# Patient Record
Sex: Male | Born: 1955
Health system: Southern US, Community
[De-identification: ages and names within clinical notes are randomized; demographics above are authoritative.]

## PROBLEM LIST (undated history)

## (undated) DIAGNOSIS — K219 Gastro-esophageal reflux disease without esophagitis: Secondary | ICD-10-CM

## (undated) DIAGNOSIS — M1990 Unspecified osteoarthritis, unspecified site: Secondary | ICD-10-CM

## (undated) DIAGNOSIS — M0230 Reiter's disease, unspecified site: Secondary | ICD-10-CM

## (undated) DIAGNOSIS — J209 Acute bronchitis, unspecified: Secondary | ICD-10-CM

## (undated) DIAGNOSIS — G4700 Insomnia, unspecified: Secondary | ICD-10-CM

## (undated) DIAGNOSIS — K635 Polyp of colon: Secondary | ICD-10-CM

## (undated) DIAGNOSIS — K5790 Diverticulosis of intestine, part unspecified, without perforation or abscess without bleeding: Secondary | ICD-10-CM

## (undated) DIAGNOSIS — T7840XA Allergy, unspecified, initial encounter: Secondary | ICD-10-CM

## (undated) DIAGNOSIS — D229 Melanocytic nevi, unspecified: Secondary | ICD-10-CM

## (undated) HISTORY — DX: Melanocytic nevi, unspecified: D22.9

## (undated) HISTORY — PX: COLONOSCOPY W/ POLYPECTOMY: SHX1380

## (undated) HISTORY — DX: Diverticulosis of intestine, part unspecified, without perforation or abscess without bleeding: K57.90

## (undated) HISTORY — DX: Acute bronchitis, unspecified: J20.9

## (undated) HISTORY — DX: Unspecified osteoarthritis, unspecified site: M19.90

## (undated) HISTORY — DX: Reiter's disease, unspecified site: M02.30

## (undated) HISTORY — DX: Allergy, unspecified, initial encounter: T78.40XA

## (undated) HISTORY — DX: Insomnia, unspecified: G47.00

## (undated) HISTORY — DX: Gastro-esophageal reflux disease without esophagitis: K21.9

## (undated) HISTORY — DX: Polyp of colon: K63.5

---

## 2000-08-06 ENCOUNTER — Encounter: Payer: Self-pay | Admitting: Family Medicine

## 2000-08-06 ENCOUNTER — Encounter: Admission: RE | Admit: 2000-08-06 | Discharge: 2000-08-06 | Payer: Self-pay | Admitting: Family Medicine

## 2003-06-17 ENCOUNTER — Emergency Department (HOSPITAL_COMMUNITY): Admission: EM | Admit: 2003-06-17 | Discharge: 2003-06-18 | Payer: Self-pay | Admitting: Emergency Medicine

## 2003-06-18 ENCOUNTER — Encounter: Payer: Self-pay | Admitting: Emergency Medicine

## 2003-08-02 ENCOUNTER — Encounter: Admission: RE | Admit: 2003-08-02 | Discharge: 2003-08-02 | Payer: Self-pay | Admitting: Family Medicine

## 2006-09-17 ENCOUNTER — Ambulatory Visit: Payer: Self-pay | Admitting: Family Medicine

## 2006-12-13 ENCOUNTER — Ambulatory Visit: Payer: Self-pay | Admitting: Family Medicine

## 2007-01-12 ENCOUNTER — Ambulatory Visit: Payer: Self-pay | Admitting: Family Medicine

## 2007-01-12 LAB — CONVERTED CEMR LAB
Cholesterol: 187 mg/dL (ref 0–200)
Glucose, Bld: 101 mg/dL — ABNORMAL HIGH (ref 70–99)
HDL: 40 mg/dL (ref >39.0)
LDL Cholesterol: 129 mg/dL — ABNORMAL HIGH (ref 0–99)
PSA: 0.55 ng/mL (ref 0.10–4.00)
Total CHOL/HDL Ratio: 4.7
Triglycerides: 92 mg/dL (ref 0–149)
VLDL: 18 mg/dL (ref 0–40)

## 2007-01-25 ENCOUNTER — Ambulatory Visit: Payer: Self-pay | Admitting: Internal Medicine

## 2007-02-07 ENCOUNTER — Encounter (INDEPENDENT_AMBULATORY_CARE_PROVIDER_SITE_OTHER): Payer: Self-pay | Admitting: Specialist

## 2007-02-07 ENCOUNTER — Ambulatory Visit: Payer: Self-pay | Admitting: Internal Medicine

## 2007-02-07 DIAGNOSIS — D126 Benign neoplasm of colon, unspecified: Secondary | ICD-10-CM | POA: Insufficient documentation

## 2007-02-07 LAB — HM COLONOSCOPY

## 2007-02-16 ENCOUNTER — Ambulatory Visit: Payer: Self-pay | Admitting: Family Medicine

## 2007-02-16 ENCOUNTER — Encounter: Payer: Self-pay | Admitting: Family Medicine

## 2007-02-16 ENCOUNTER — Encounter (INDEPENDENT_AMBULATORY_CARE_PROVIDER_SITE_OTHER): Payer: Self-pay | Admitting: Family Medicine

## 2007-02-16 LAB — CONVERTED CEMR LAB
Bilirubin Urine: NEGATIVE
Blood in Urine, dipstick: NEGATIVE
Glucose, Urine, Semiquant: NEGATIVE
Ketones, urine, test strip: NEGATIVE
Nitrite: NEGATIVE
Specific Gravity, Urine: 1.015
Urobilinogen, UA: 0.2
WBC Urine, dipstick: NEGATIVE

## 2007-05-23 ENCOUNTER — Telehealth (INDEPENDENT_AMBULATORY_CARE_PROVIDER_SITE_OTHER): Payer: Self-pay | Admitting: *Deleted

## 2007-07-01 ENCOUNTER — Ambulatory Visit: Payer: Self-pay | Admitting: Family Medicine

## 2007-07-20 ENCOUNTER — Ambulatory Visit: Payer: Self-pay | Admitting: Family Medicine

## 2007-07-22 ENCOUNTER — Telehealth (INDEPENDENT_AMBULATORY_CARE_PROVIDER_SITE_OTHER): Payer: Self-pay | Admitting: *Deleted

## 2007-08-12 ENCOUNTER — Telehealth (INDEPENDENT_AMBULATORY_CARE_PROVIDER_SITE_OTHER): Payer: Self-pay | Admitting: *Deleted

## 2007-12-14 ENCOUNTER — Telehealth (INDEPENDENT_AMBULATORY_CARE_PROVIDER_SITE_OTHER): Payer: Self-pay | Admitting: *Deleted

## 2007-12-15 ENCOUNTER — Telehealth (INDEPENDENT_AMBULATORY_CARE_PROVIDER_SITE_OTHER): Payer: Self-pay | Admitting: *Deleted

## 2007-12-27 ENCOUNTER — Ambulatory Visit: Payer: Self-pay | Admitting: Internal Medicine

## 2007-12-27 DIAGNOSIS — J309 Allergic rhinitis, unspecified: Secondary | ICD-10-CM | POA: Insufficient documentation

## 2007-12-27 DIAGNOSIS — G4700 Insomnia, unspecified: Secondary | ICD-10-CM | POA: Insufficient documentation

## 2008-10-22 ENCOUNTER — Ambulatory Visit: Payer: Self-pay | Admitting: Internal Medicine

## 2008-10-22 DIAGNOSIS — M0230 Reiter's disease, unspecified site: Secondary | ICD-10-CM | POA: Insufficient documentation

## 2008-10-22 DIAGNOSIS — H209 Unspecified iridocyclitis: Secondary | ICD-10-CM | POA: Insufficient documentation

## 2008-10-22 LAB — CONVERTED CEMR LAB
Bilirubin Urine: NEGATIVE
Blood in Urine, dipstick: NEGATIVE
Glucose, Urine, Semiquant: NEGATIVE
Ketones, urine, test strip: NEGATIVE
Nitrite: NEGATIVE
Protein, U semiquant: NEGATIVE
Specific Gravity, Urine: 1.01
Urobilinogen, UA: 0.2
WBC Urine, dipstick: NEGATIVE
pH: 6

## 2008-10-30 ENCOUNTER — Encounter (INDEPENDENT_AMBULATORY_CARE_PROVIDER_SITE_OTHER): Payer: Self-pay | Admitting: *Deleted

## 2008-11-05 ENCOUNTER — Encounter: Payer: Self-pay | Admitting: Internal Medicine

## 2009-01-14 ENCOUNTER — Encounter: Payer: Self-pay | Admitting: Internal Medicine

## 2009-01-28 ENCOUNTER — Ambulatory Visit: Payer: Self-pay | Admitting: Internal Medicine

## 2009-02-01 ENCOUNTER — Ambulatory Visit: Payer: Self-pay | Admitting: Internal Medicine

## 2009-02-06 LAB — CONVERTED CEMR LAB
Cholesterol: 208 mg/dL — ABNORMAL HIGH (ref 0–200)
Direct LDL: 134 mg/dL
HDL: 46.2 mg/dL (ref >39.00)
PSA: 0.67 ng/mL (ref 0.10–4.00)
TSH: 2.2 microintl units/mL (ref 0.35–5.50)
Total CHOL/HDL Ratio: 5
Triglycerides: 123 mg/dL (ref 0.0–149.0)
VLDL: 24.6 mg/dL (ref 0.0–40.0)

## 2009-02-11 ENCOUNTER — Encounter: Payer: Self-pay | Admitting: Internal Medicine

## 2009-04-15 ENCOUNTER — Encounter: Payer: Self-pay | Admitting: Internal Medicine

## 2009-07-23 ENCOUNTER — Encounter: Payer: Self-pay | Admitting: Internal Medicine

## 2009-07-29 ENCOUNTER — Telehealth (INDEPENDENT_AMBULATORY_CARE_PROVIDER_SITE_OTHER): Payer: Self-pay | Admitting: *Deleted

## 2009-11-14 ENCOUNTER — Encounter: Payer: Self-pay | Admitting: Internal Medicine

## 2010-01-20 ENCOUNTER — Encounter (INDEPENDENT_AMBULATORY_CARE_PROVIDER_SITE_OTHER): Payer: Self-pay | Admitting: *Deleted

## 2010-01-31 ENCOUNTER — Ambulatory Visit: Payer: Self-pay | Admitting: Internal Medicine

## 2010-02-27 ENCOUNTER — Ambulatory Visit: Payer: Self-pay | Admitting: Internal Medicine

## 2010-03-04 LAB — CONVERTED CEMR LAB
BUN: 19 mg/dL (ref 6–23)
CO2: 27 meq/L (ref 19–32)
Calcium: 8.7 mg/dL (ref 8.4–10.5)
Chloride: 104 meq/L (ref 96–112)
Cholesterol: 207 mg/dL — ABNORMAL HIGH (ref 0–200)
Creatinine, Ser: 0.8 mg/dL (ref 0.4–1.5)
Direct LDL: 139.8 mg/dL
GFR calc non Af Amer: 110.63 mL/min (ref >60)
Glucose, Bld: 90 mg/dL (ref 70–99)
HDL: 60 mg/dL (ref >39.00)
PSA: 0.79 ng/mL (ref 0.10–4.00)
Potassium: 4.2 meq/L (ref 3.5–5.1)
Sodium: 139 meq/L (ref 135–145)
TSH: 2.06 microintl units/mL (ref 0.35–5.50)
Total CHOL/HDL Ratio: 3
Triglycerides: 104 mg/dL (ref 0.0–149.0)
VLDL: 20.8 mg/dL (ref 0.0–40.0)

## 2010-03-26 ENCOUNTER — Ambulatory Visit: Payer: Self-pay | Admitting: Internal Medicine

## 2010-03-26 DIAGNOSIS — R059 Cough, unspecified: Secondary | ICD-10-CM | POA: Insufficient documentation

## 2010-03-26 DIAGNOSIS — R05 Cough: Secondary | ICD-10-CM

## 2010-03-27 ENCOUNTER — Ambulatory Visit: Payer: Self-pay | Admitting: Internal Medicine

## 2010-03-31 ENCOUNTER — Telehealth (INDEPENDENT_AMBULATORY_CARE_PROVIDER_SITE_OTHER): Payer: Self-pay | Admitting: *Deleted

## 2010-04-10 ENCOUNTER — Ambulatory Visit: Payer: Self-pay | Admitting: Pulmonary Disease

## 2010-06-02 ENCOUNTER — Encounter: Payer: Self-pay | Admitting: Internal Medicine

## 2010-08-04 ENCOUNTER — Encounter: Payer: Self-pay | Admitting: Internal Medicine

## 2010-10-27 ENCOUNTER — Encounter: Payer: Self-pay | Admitting: Internal Medicine

## 2010-11-06 NOTE — Assessment & Plan Note (Signed)
Summary: still congested/cbs   Vital Signs:  Patient profile:   55 year old male Height:      69 inches Weight:      234 pounds O2 Sat:      95 % on Room air Temp:     97.9 degrees F oral Pulse rate:   87 / minute BP sitting:   118 / 78  (left arm)  Vitals Entered By: Jeremy Johann CMA (March 26, 2010 2:21 PM)  O2 Flow:  Room air CC: STILL NO BETTER Comments --COUGH LIGHT YELLOW MUCOUS   History of Present Illness: continue with cough, symptoms started in early April He has been seen here twice, status post amoxicillin and Zithromax. He's not completely well.  Occasional cough, white to yellow sputum. He still has chest congestion without wheezing per se    ROS denies fever, weight loss, night sweats No hemoptysis No chest pain One time got SOB and slightly dizzy with exertion 2 weeks ago, since then he has been able to exercise without problems No history of tobacco abuse or asthma occ  GERD symptoms Allergy symptoms, itchy eyes- nose and sneezing, well-controlled with over-the-counter Allegra    Allergies: No Known Drug Allergies  Past History:  Past Medical History: Reviewed history from 01/28/2009 and no changes required. colonoscopy   Date:  02/07/2007   Next Due:  02/2012   Results:  Adenomatous Polyp  (Dr Marina Goodell)  Allergic rhinitis insomnia Reiter's Syndrome Dx remotely and again  12-2008 ( L  eye iritys  R foot problems ) Moles-- sees dermatology  Past Surgical History: Reviewed history from 12/27/2007 and no changes required. no  Social History: Reviewed history from 01/31/2010 and no changes required. 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   Ears:  R ear normal and L ear normal.   Nose:  not  congested Mouth:  no redness or discharge Lungs:  normal respiratory effort, no intercostal retractions, no accessory muscle use, and normal breath sounds.   Heart:  normal rate, regular rhythm, and no murmur.   Extremities:  no edema   Impression & Recommendations:  Problem # 1:  COUGH (ICD-786.2) persistent cough DDX includes reactive airway disease, GERD, postnasal dripping. Plan: chest x-ray  PFTs pulmonary referral Add  Zantac (PPIs interact with MTX)  Orders: T-2 View CXR (71020TC) Pulmonary Referral (Pulmonary) Pulmonary Referral (Pulmonary)  Problem # 2:  REITER'S DISEASE (ICD-099.3) on MTX, requests a pneumonia shot. I agree or that he needs one because he is immunosuppressed.  Complete Medication List: 1)  Zolpidem Tartrate 10 Mg Tabs (Zolpidem tartrate) .... Take one tablet at bedtime as needed 2)  Flonase 50 Mcg/act Susp (Fluticasone propionate) .... 2 spr qd 3)  Methotrexate 2.5 Mg Tabs (Methotrexate sodium) .... 3 by mouth two times a day 4)  Folic Acid 1 Mg Tabs (Folic acid) .... Once daily 5)  Lotemax 0.5 % Susp (Loteprednol etabonate) .Marland Kitchen.. 1 gtt os qid 6)  Ranitidine Hcl 300 Mg Tabs (Ranitidine hcl) .... One by mouth at bedtime  Other Orders: Pneumococcal Vaccine (16606) Admin 1st Vaccine (30160) Admin 1st Vaccine [facility]) [id number])  Patient Instructions: 1)  continue with Allegra as you're doing 2)  ranitidine  daily Prescriptions: RANITIDINE HCL 300 MG  TABS (RANITIDINE HCL) one by mouth at bedtime  #30 x 6   Entered and Authorized by:   Elita Quick E. Paz MD   Signed by:   Nolon Rod. Paz MD on 03/26/2010   Method used:   Electronically to        [facility] #[id number]* (retail)       [address]       [place], [place]  [address]       Ph: [phone]       Fax: [phone]   RxID:   [id number]    Pneumovax Vaccine    Vaccine Type: Pneumovax    Site: right deltoid    Mfr: Merck    Dose: 0.5 ml     Route: IM    Given by: Jeremy Johann CMA    Exp. Date: 07/30/2011    Lot #: [id number]    VIS given: 05/02/96 version given March 26, 2010.

## 2010-11-06 NOTE — Assessment & Plan Note (Signed)
Summary: persistent cough/jd   Visit Type:  Initial Consult Copy to:  pcp Primary Provider/Referring Provider:  Nolon Rod. Paz MD  CC:  Pt c/o chest congestion x 3 months. Pt states has been 2 rounds of abx with little relief during course.  History of Present Illness: 54/M, never smoker for evaluation of cough x 3 months. He has reiters' syndrome x 1 yr & is maintained on methotrexate 12.5 mg twice/ week & prednisone being taperd down to 5 mg now. He developed a head cold in april 2011& since hten has a cough productiv eof white phlegm, minimal especially in am. HE received 2 rounds of Abx, & was put on allegra intially , then ranitidine for GERD although he denies heartburn. Cough has now improved  - symptomatic Rx with cough syrups or mucinex did not help. CXR was nml. HE reports post nasal drip, denies heartburn or childhood h/o asthma. There is no clear environmental truigger, 1 dog at home, no diurnal variation or similar episodes in the past.  Current Medications (verified): 1)  Zolpidem Tartrate 10 Mg Tabs (Zolpidem Tartrate) .... Take One Tablet At Bedtime 2)  Flonase 50 Mcg/act Susp (Fluticasone Propionate) .... 2 Spr Once Daily As Needed 3)  Methotrexate 2.5 Mg Tabs (Methotrexate Sodium) .... 5 Tabs By Mouth Two Days A Week 4)  Folic Acid 1 Mg Tabs (Folic Acid) .... Once Daily 5)  Lotemax 0.5 % Susp (Loteprednol Etabonate) .Marland Kitchen.. 1 Gtt Os Qid 6)  Ranitidine Hcl 300 Mg Tabs (Ranitidine Hcl) .... One By Mouth At Bedtime 7)  Prednisone 5 Mg Tabs (Prednisone) .... Take 1 Tablet By Mouth Once A Day 8)  Calcium 600 Mg Tabs (Calcium) .... Take 1 Tablet By Mouth Once A Day 9)  Otc Allegra .... Take 1 Tablet By Mouth Once A Day  Allergies (verified): No Known Drug Allergies  Past History:  Past Medical History: Last updated: 01/28/2009 colonoscopy   Date:  02/07/2007   Next Due:  02/2012   Results:  Adenomatous Polyp  (Dr Marina Goodell)  Allergic rhinitis insomnia Reiter's Syndrome Dx  remotely and again  12-2008 ( L  eye iritys  R foot problems ) Moles-- sees dermatology  Past Surgical History: Last updated: 12/27/2007 no  Family History: Last updated: 01/28/2009 Dementia-- F MI--no DM--no colon ca-- no prostate ca-- no  (apparently F had BPH)  Social History: Last updated: 01/31/2010 Occupation: real Engineer, drilling Married with 2 children Never Smoked Alcohol use-yes: socially diet-- trying to eat more veggies  exercise--  "Y" twice a week up until recently   Review of Systems       The patient complains of productive cough, acid heartburn, nasal congestion/difficulty breathing through nose, and joint stiffness or pain.  The patient denies shortness of breath with activity, shortness of breath at rest, non-productive cough, coughing up blood, chest pain, irregular heartbeats, indigestion, loss of appetite, weight change, abdominal pain, difficulty swallowing, sore throat, tooth/dental problems, headaches, sneezing, itching, ear ache, anxiety, depression, hand/feet swelling, rash, change in color of mucus, and fever.    Vital Signs:  Patient profile:   55 year old male Height:      69 inches Weight:      234 pounds BMI:     34.68 O2 Sat:      96 % on Room air Temp:     98.3 degrees F oral Pulse rate:   75 / minute BP sitting:   110 / 82  (left arm) Cuff size:  regular  Vitals Entered By: Zackery Barefoot CMA (April 10, 2010 3:10 PM)  O2 Flow:  Room air CC: Pt c/o chest congestion x 3 months. Pt states has been 2 rounds of abx with little relief during course Comments Medications reviewed with patient Verified contact number and pharmacy with patient Zackery Barefoot CMA  April 10, 2010 3:13 PM    Physical Exam  Additional Exam:  pleasant HEENT - no thrush, no post nasal drip no lymphadenopathy CVS- s1s2 nml, no murmur, no JVD RS- clear, no crackles or rhonchi Abd- soft, non-tender, no organomegaly CNS- non focal Ext - no clubbing, cyanosis  or edema 4   Impression & Recommendations:  Problem # 1:  COUGH (ICD-786.2) Assessment Improved  cough was most likely post bronchitic , may have been worsened by post nasal (sinus) drip Hopefully no Rx required now since resolving. Stay on zantac x 3 more weeks Take zyrtec OTC instead of allegra x 4 weeks then stop If cough comes back, we may have to target the sinuses with therapy. Pulmomary complications of methotrexate were discussed  Orders: Consultation Level III ([address])  Medications Added to Medication List This Visit: 1)  Zolpidem Tartrate 10 Mg Tabs (Zolpidem tartrate) .... Take one tablet at bedtime 2)  Flonase 50 Mcg/act Susp (Fluticasone propionate) .... 2 spr once daily as needed 3)  Methotrexate 2.5 Mg Tabs (Methotrexate sodium) .... 5 tabs by mouth two days a week 4)  Prednisone 5 Mg Tabs (Prednisone) .... Take 1 tablet by mouth once a day 5)  Calcium 600 Mg Tabs (Calcium) .... Take 1 tablet by mouth once a day 6)  Otc Allegra  .... Take 1 tablet by mouth once a day  Patient Instructions: 1)  Copy sent to:dr paz 2)  Please schedule a follow-up appointment as needed. 3)  Your cough was most likely post bronchitis , may have been worsened by post nasal (sinus) drip 4)  Stay on zantac x 3 more weeks 5)  Take zyrtec OTC instead of allegra x 4 weeks then stop 6)  If cough comes back, we may have to target the sinuses with therapy.

## 2010-11-06 NOTE — Progress Notes (Signed)
Summary: -x-ray result  Phone Note Outgoing Call   Call placed by: Jeremy Johann CMA,  March 31, 2010 4:51 PM Details for Reason: advise patient, chest x-ray normal Summary of Call: left message to call  office.................Marland KitchenFelecia Deloach CMA  March 31, 2010 4:51 PM   Follow-up for Phone Call        Pt is aware. Army Fossa CMA  April 01, 2010 11:57 AM

## 2010-11-06 NOTE — Letter (Signed)
Summary: Stacey Drain MD Rheumatology  Stacey Drain MD Rheumatology   Imported By: Lanelle Bal 06/25/2010 10:41:17  _____________________________________________________________________  External Attachment:    Type:   Image     Comment:   External Document

## 2010-11-06 NOTE — Letter (Signed)
Summary: Stacey Drain MD Rheumatology  Stacey Drain MD Rheumatology   Imported By: Lanelle Bal 11/22/2009 13:52:51  _____________________________________________________________________  External Attachment:    Type:   Image     Comment:   External Document

## 2010-11-06 NOTE — Assessment & Plan Note (Signed)
Summary: cough/send to lab -BMP-FLP-PSA-TSH-V70/cbs   Vital Signs:  Patient profile:   55 year old male Height:      69 inches Weight:      232.25 pounds BMI:     34.42 Temp:     97.1 degrees F oral BP sitting:   130 / 80  Vitals Entered By: Kandice Hams (Feb 27, 2010 8:35 AM) CC: c/o cough is back with production/yellow   History of Present Illness: was seen with cough about a month ago, he took amoxicillin , symptoms were better temporarily. Continue with cough and occasional yellow sputum  Allergies: No Known Drug Allergies  Past History:  Past Medical History: Reviewed history from 01/28/2009 and no changes required. colonoscopy   Date:  02/07/2007   Next Due:  02/2012   Results:  Adenomatous Polyp  (Dr Marina Goodell)  Allergic rhinitis insomnia Reiter's Syndrome Dx remotely and again  12-2008 ( L  eye iritys  R foot problems ) Moles-- sees dermatology  Past Surgical History: Reviewed history from 12/27/2007 and no changes required. no  Review of Systems       no fever No wheezing No major problems with allergies like itchy eyes or itchy nose. Currently the sinus congestion is almost gone  Physical Exam  General:  alert and well-developed.   Head:  face symmetric, not tender to palpation Nose:  not congested Mouth:  no redness or discharge Lungs:  clear to auscultation bilaterally   Impression & Recommendations:  Problem # 1:  BRONCHITIS- ACUTE (ICD-466.0) persistent cough with some sputum sinus symptoms and allergies better Plan: cover for atypical infections with Zithromax,Robitussin-DM, patient will call in few weeks if no better I prescribed prednisone but he is already taking prednisone by mouth, prescription voided His updated medication list for this problem includes:    Zithromax Z-pak 250 Mg Tabs (Azithromycin) .Marland Kitchen... As directed  Complete Medication List: 1)  Zolpidem Tartrate 10 Mg Tabs (Zolpidem tartrate) .... Take one tablet at bedtime as  needed 2)  Flonase 50 Mcg/act Susp (Fluticasone propionate) .... 2 spr qd 3)  Methotrexate 2.5 Mg Tabs (Methotrexate sodium) .... 3 by mouth two times a day 4)  Prednisone 10 Mg Tabs (Prednisone) .... As directed 5)  Folic Acid 1 Mg Tabs (Folic acid) .... Once daily 6)  Lotemax 0.5 % Susp (Loteprednol etabonate) .Marland Kitchen.. 1 gtt os qid 7)  Zithromax Z-pak 250 Mg Tabs (Azithromycin) .... As directed  Other Orders: Venipuncture (08144) TLB-BMP (Basic Metabolic Panel-BMET) (80048-METABOL) TLB-TSH (Thyroid Stimulating Hormone) (84443-TSH) TLB-PSA (Prostate Specific Antigen) (84153-PSA) TLB-Lipid Panel (80061-LIPID)  Patient Instructions: 1)  continue with allegra 2)  zpack , antibiotic, as prescribed 3)    Prescriptions: PREDNISONE 10 MG TABS (PREDNISONE) 3 by mouth once daily x 3, 2x3,1x3  #15 x 0   Entered and Authorized by:   Ebonye Reade E. Paxton Kanaan MD   Signed by:   Nolon Rod. Annalysa Mohammad MD on 02/27/2010   Method used:   Electronically to        [facility] #[id number]* (retail)       [address]       [place], [place]  [address]       Ph: [phone]       Fax: [phone]   RxID:   [id number] ZITHROMAX Z-PAK 250 MG TABS (AZITHROMYCIN) as directed  #1 x 0   Entered and Authorized by:   Nolon Rod. Rawan Riendeau MD  Signed by:   Nolon Rod Jeanpierre Thebeau MD on 02/27/2010   Method used:   Electronically to        [facility] #[id number]* (retail)       [address]       [place], [place]  [address]       Ph: [phone]       Fax: [phone]   RxID:   [id number]

## 2010-11-06 NOTE — Letter (Signed)
Summary: Primary Care Appointment Letter  [facility]  [address] [place], [place] [address]   Phone: [phone]  Fax: [phone]    01/20/2010 MRN: [id number]  Steven Lara [address] [place], [place]  [address]  Dear Mr. Grayson,   Your Primary Care Physician [place] E. Paz MD has indicated that:    ___X____it is time to schedule an appointment.  Please call our office @ [phone] to schedule a complete physical with Dr. Drue Novel.    Thank you,     Primary Care Scheduler

## 2010-11-06 NOTE — Assessment & Plan Note (Signed)
Summary: CPX--PH   Vital Signs:  Patient profile:   55 year old male Height:      69 inches Weight:      227.6 pounds BMI:     33.73 Pulse rate:   74 / minute BP sitting:   110 / 80  Vitals Entered By: Shary Decamp (January 31, 2010 2:08 PM) CC: cpx, not fasting   History of Present Illness: CPX also having a cold for 4 weeks: Chest congestion, cough, some sputum ; overall better for the last few days no fever slight sinus congestion, itchy eyes and nose  Preventive Screening-Counseling & Management  Caffeine-Diet-Exercise     Caffeine use/day: 2  Current Medications (verified): 1)  Zolpidem Tartrate 10 Mg Tabs (Zolpidem Tartrate) .... Take One Tablet At Bedtime As Needed - Due Office Visit For Additional Refills 2)  Flonase 50 Mcg/act Susp (Fluticasone Propionate) .... 2 Spr Qd 3)  Methotrexate 2.5 Mg Tabs (Methotrexate Sodium) .... 3 By Mouth Two Times A Day 4)  Prednisone 10 Mg Tabs (Prednisone) .... As Directed 5)  Folic Acid 1 Mg Tabs (Folic Acid) .... Once Daily 6)  Lotemax 0.5 % Susp (Loteprednol Etabonate) .Marland Kitchen.. 1 Gtt Os Qid  Allergies (verified): No Known Drug Allergies  Past History:  Past Medical History: Reviewed history from 01/28/2009 and no changes required. colonoscopy   Date:  02/07/2007   Next Due:  02/2012   Results:  Adenomatous Polyp  (Dr Marina Goodell)  Allergic rhinitis insomnia Reiter's Syndrome Dx remotely and again  12-2008 ( L  eye iritys  R foot problems ) Moles-- sees dermatology  Past Surgical History: Reviewed history from 12/27/2007 and no changes required. no  Family History: Reviewed history from 01/28/2009 and no changes required. Dementia-- F MI--no DM--no colon ca-- no prostate ca-- no  (apparently F had BPH)  Social History: Reviewed history from 01/28/2009 and no changes required. 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 GI:  Denies bloody stools, diarrhea, nausea, and vomiting. GU:  Denies dysuria, hematuria, urinary frequency, and urinary hesitancy. Psych:  Denies anxiety and depression.  Physical Exam  General:  alert and well-developed.   Head:  face symetric , slightly  tender at maxilary and frontal sinus  Ears:  R ear normal and L ear normal.   Mouth:  no redness or d/c  Lungs:  normal respiratory effort, no intercostal retractions, no accessory muscle use, and normal breath sounds.   Heart:  normal rate, regular rhythm, and no murmur.   Abdomen:  soft, non-tender, no distention, no masses, no guarding, and no rigidity.   Rectal:  ext hemorrhoids  noted. Normal sphincter tone. No rectal masses or tenderness. Prostate:  Prostate gland firm and smooth, no enlargement, nodularity, tenderness, mass, asymmetry or induration. Extremities:  no pretibial edema bilaterally  Psych:  Oriented X3, memory intact for recent and remote, normally interactive, good eye contact, not anxious appearing, and not depressed appearing.     Impression & Recommendations:  Problem # 1:  HEALTH SCREENING (ICD-V70.0) adacel 01-2007 rec healthy diet and exercise Colonoscopy (Dr Marina Goodell) :   Date:  02/07/2007   Next Due:  02/2012 multiple moles, last seen derm 2.5 years ago,  again   rec to see dermatology yearly  2/11 labs @ rheumatology  showed a white count of 11, hemoglobin 15.4, platelets 225.  Glucose 125, creatinine 0.8, AST 15. labs, see instructions   Problem # 2:  ALLERGIC RHINITIS (ICD-477.9) allergies +/- sinusitis. see instructions   His updated medication list for this problem includes:    Flonase 50 Mcg/act Susp (Fluticasone propionate) .Marland Kitchen... 2 spr qd  Problem # 3:  INSOMNIA-SLEEP DISORDER-UNSPEC (ICD-780.52) refill medications His updated medication list for this  problem includes:    Zolpidem Tartrate 10 Mg Tabs (Zolpidem tartrate) .Marland Kitchen... Take one tablet at bedtime as needed  Complete Medication List: 1)  Zolpidem Tartrate 10 Mg Tabs (Zolpidem tartrate) .... Take one tablet at bedtime as needed 2)  Flonase 50 Mcg/act Susp (Fluticasone propionate) .... 2 spr qd 3)  Methotrexate 2.5 Mg Tabs (Methotrexate sodium) .... 3 by mouth two times a day 4)  Prednisone 10 Mg Tabs (Prednisone) .... As directed 5)  Folic Acid 1 Mg Tabs (Folic acid) .... Once daily 6)  Lotemax 0.5 % Susp (Loteprednol etabonate) .Marland Kitchen.. 1 gtt os qid 7)  Amoxicillin 500 Mg Caps (Amoxicillin) .... 2 by mouth two times a day  Patient Instructions: 1)  rest-fluids-tylenol 2)  mucinex DM two times a day as needed for cough  unti lbetter 3)  amoxicillin x 10 days 4)  claritin 10mg  a day  x 1 week 5)  call if no better in few days  6)  came back fasting for labs: 7)  BMP FLP PSA TSH----dx V70 8)  Please schedule a follow-up appointment in 1 year.  Prescriptions: ZOLPIDEM TARTRATE 10 MG TABS (ZOLPIDEM TARTRATE) Take one tablet at bedtime as needed  #30 x 6   Entered and Authorized by:   Elita Quick E. Paz MD   Signed by:   Nolon Rod. Paz MD on 01/31/2010   Method used:   Print then Give to Patient   RxID:   [id number] AMOXICILLIN 500 MG CAPS (AMOXICILLIN) 2 by mouth two times a day  #40 x 0   Entered and Authorized by:   Nolon Rod. Paz MD   Signed by:   Nolon Rod. Paz MD on 01/31/2010   Method used:   Print then Give to Patient   RxID:   [id number] FLONASE 50 MCG/ACT SUSP (FLUTICASONE PROPIONATE) 2 spr qd  #1 x 11   Entered by:   Shary Decamp   Authorized by:   Nolon Rod. Paz MD   Signed by:   Shary Decamp on 01/31/2010   Method used:   Electronically to        [facility] #[id number]* (retail)       [address]       [place], [place]  [address]       Ph: [phone]       Fax: [phone]   RxID:   [id number]    Preventive Care  Screening  Prior Values:    PSA:  0.67 (02/01/2009)    Colonoscopy:  Adenomatous Polyp (02/07/2007)    Last Tetanus Booster:  Historical (01/12/2007)    Risk Factors:  Tobacco use:  never Passive smoke exposure:  no Drug use:  no Caffeine use:  2 drinks per day Alcohol use:  yes    Type:  socially Exercise:  yes    Times per week:  2    Type:  walking/biking  Colonoscopy History:    Date of Last Colonoscopy:  02/07/2007

## 2010-11-06 NOTE — Letter (Signed)
Summary: Stacey Drain MD Rheumatology  Stacey Drain MD Rheumatology   Imported By: Lanelle Bal 08/14/2010 13:18:59  _____________________________________________________________________  External Attachment:    Type:   Image     Comment:   External Document

## 2010-11-26 NOTE — Letter (Signed)
Summary: Rheumatology-Dr. Stacey Drain  Rheumatology-Dr. Stacey Drain   Imported By: Maryln Gottron 11/10/2010 11:06:56  _____________________________________________________________________  External Attachment:    Type:   Image     Comment:   External Document

## 2011-01-09 ENCOUNTER — Other Ambulatory Visit: Payer: Self-pay | Admitting: Internal Medicine

## 2011-01-22 ENCOUNTER — Ambulatory Visit (INDEPENDENT_AMBULATORY_CARE_PROVIDER_SITE_OTHER): Payer: PRIVATE HEALTH INSURANCE | Admitting: Internal Medicine

## 2011-01-22 ENCOUNTER — Encounter: Payer: Self-pay | Admitting: Internal Medicine

## 2011-01-22 DIAGNOSIS — M549 Dorsalgia, unspecified: Secondary | ICD-10-CM

## 2011-01-22 DIAGNOSIS — M546 Pain in thoracic spine: Secondary | ICD-10-CM

## 2011-01-22 MED ORDER — CYCLOBENZAPRINE HCL 10 MG PO TABS: 10.0000 mg | ORAL_TABLET | Freq: Two times a day (BID) | ORAL | Status: AC | PRN

## 2011-01-22 MED ORDER — HYDROCODONE-ACETAMINOPHEN 5-500 MG PO TABS: 1.0000 | ORAL_TABLET | Freq: Three times a day (TID) | ORAL | Status: DC | PRN

## 2011-01-22 NOTE — Assessment & Plan Note (Signed)
Upper back pain for a few days, some ill-defined paresthesias in the left arm, neurological exam normal. Most likely this is due to a musculoskeletal issue, is less likely that this is related to Reiter's syndrome. Plan: See instructions

## 2011-01-22 NOTE — Patient Instructions (Signed)
Motrin 200mg  OTC 2 tabs every 6 hours as needed Heating pad Hydrocodone and flexeril (muscle relaxant) as needed , will cause drowsiness Call if no better in few days

## 2011-01-22 NOTE — Progress Notes (Signed)
  Subjective:    Patient ID: Steven Lara, male    DOB: 11/29/1955, 55 y.o.   MRN: [id number]  HPI 5 days ago developed upper back pain, mostly left sided, he also felt a "knot" but the left shoulder blade. He can't sleep due to the discomfort however he has been able to do some yard work even today. For days ago he developed ill-defined and tingling in the left arm weak grip.  Past Medical History  Diagnosis Date  . Allergic rhinitis   . Insomnia   . Reiter's syndrome     dx remotely and again 12/2008 (L eye iritys R foot problems)  . Mole (skin)     sees derm   No past surgical history on file.   Review of Systems Very mild neck pain per se, actually the neck is described as "stiff" He admits to a lot of emotional distress lately mostly due to work and being extremely busy Denies recent injuries or heavy lifting As far as  Reiter's syndrome, he sees rheumatology routinely, he takes methotrexate. He does not believe his pain is related to that condition. Denies fevers    Objective:   Physical Exam  Constitutional: He is oriented to person, place, and time. He appears well-developed and well-nourished.  Neck: Normal range of motion. Neck supple.  Musculoskeletal:       Palpation of the neck and upper back without any abnormalities.  Neurological: He is alert and oriented to person, place, and time. He has normal reflexes. He exhibits normal muscle tone.       Motor exam of the upper extremities normal          Assessment & Plan:

## 2011-02-11 ENCOUNTER — Other Ambulatory Visit: Payer: Self-pay | Admitting: Internal Medicine

## 2011-02-11 NOTE — Telephone Encounter (Signed)
Ok 30, 5 RF 

## 2011-02-20 NOTE — Assessment & Plan Note (Signed)
[facility]                        [place] OFFICE NOTE   NAME:Steven Lara, Steven Lara                         MRN:          [id number]  DATE:12/13/2006                            DOB:          1955-12-20    REASON FOR VISIT:  Discomfort when sitting.   HISTORY OF PRESENT ILLNESS:  Steven Lara is a 55 year old male who over  the last four days he has been having a annoyance type of discomfort  when he sits down.  He states it is located just below his coccyx.  He  reports it is not tender to the touch.  He does sit in front of the  computer for several hours a day.  He also notes a slight increase in  bowel movement.  There has been no diarrhea or abdominal pain.  Mr.  Lara also reports that he has noticed an increase in relief after he  urinates from baseline.  He denies any dysuria or urinary frequency.  He  does report history of prostatitis 15 years ago.   MEDICATIONS:  Ambien 10 mg q.h.s. p.r.n.   ALLERGIES:  No drug allergies.   REVIEW OF SYSTEMS:  As per HPI, otherwise, unremarkable.   OBJECTIVE:  VITAL SIGNS:  Blood pressure 1112/82, weight 230, pulse 76.  GENERAL: Pleasant male in no acute distress, questioned appropriately.  ABDOMEN:  Significant for no palpable masses, nontender, positive bowel  sounds.  RECTAL:  Normal tone with a mildly enlarged boggy prostate, tender to  palpation.   STUDIES:  UA was clear.   IMPRESSION:  A 55 year old male with symptoms suggestive of new  prostatitis.   PLAN:  1. Will start on Bactrim DS one pill b.i.d. x14 days.  2. Advise if symptoms worsened, he is to follow up.  3. The patient also advised if the symptoms do not improve over the      next 10-14 days, he is to call our office.  The patient is      scheduled to follow up with me in about three weeks for a complete      physical examination.     Leanne Chang, M.D.  Electronically Signed    LA/MedQ  DD: 12/13/2006  DT: 12/14/2006   Job #: [id number]

## 2011-08-08 ENCOUNTER — Other Ambulatory Visit: Payer: Self-pay | Admitting: Internal Medicine

## 2011-08-10 MED ORDER — ZOLPIDEM TARTRATE 10 MG PO TABS: 10.0000 mg | ORAL_TABLET | Freq: Every evening | ORAL | Status: DC | PRN

## 2011-09-07 ENCOUNTER — Other Ambulatory Visit: Payer: Self-pay | Admitting: Internal Medicine

## 2011-09-14 NOTE — Telephone Encounter (Signed)
Pharmacy received refill on 08/08/11.  They did not receive refill auth on 11/5 with additional refills.  Verbal order given to Lauren for #30, with 1 additional refill.

## 2011-09-14 NOTE — Telephone Encounter (Signed)
Patient still waoting for refill [facility]

## 2011-11-07 ENCOUNTER — Other Ambulatory Visit: Payer: Self-pay | Admitting: Internal Medicine

## 2011-11-09 NOTE — Telephone Encounter (Signed)
Ambien 10mg  #30 with zero refills. Last refilled on Aug 10, 2011. OK to refill?

## 2011-11-10 NOTE — Telephone Encounter (Signed)
30, no RF, tell pt needs OV before next RF

## 2011-11-10 NOTE — Telephone Encounter (Signed)
Refill done.  

## 2011-12-10 ENCOUNTER — Other Ambulatory Visit: Payer: Self-pay | Admitting: Internal Medicine

## 2011-12-10 NOTE — Telephone Encounter (Signed)
Patient has a CPE appointment Monday 3/11.

## 2011-12-10 NOTE — Telephone Encounter (Signed)
Refill request Ambien 10mg . #30 with zero refills. Last refilled on 2.2.13. OK to refill?

## 2011-12-10 NOTE — Telephone Encounter (Signed)
Needs OV.  

## 2011-12-11 NOTE — Telephone Encounter (Signed)
Pt has appt Monday 12/14/11

## 2011-12-12 ENCOUNTER — Other Ambulatory Visit: Payer: Self-pay | Admitting: Internal Medicine

## 2011-12-14 ENCOUNTER — Ambulatory Visit (INDEPENDENT_AMBULATORY_CARE_PROVIDER_SITE_OTHER): Payer: PRIVATE HEALTH INSURANCE | Admitting: Internal Medicine

## 2011-12-14 DIAGNOSIS — Z0000 Encounter for general adult medical examination without abnormal findings: Secondary | ICD-10-CM | POA: Insufficient documentation

## 2011-12-14 MED ORDER — ZOLPIDEM TARTRATE 10 MG PO TABS: ORAL_TABLET | ORAL | Status: DC

## 2011-12-14 NOTE — Assessment & Plan Note (Signed)
adacel 01-2007 rec to continue w/ his  healthy diet and exercise Colonoscopy (Dr Marina Goodell) :   Date:  02/07/2007   Next per GI ( Due:  02/2012) Due to see dermatology   Has routine  labs @ rheumatology  , see instructions  Prostate ca screening-- guidelines are changing, rec PSA DRE q 2 years

## 2011-12-14 NOTE — Patient Instructions (Signed)
Return fasting at some point this week: FLP TSH PSA--- dx V70 Is very important  you see the dermatologist at [facility]

## 2011-12-14 NOTE — Progress Notes (Signed)
  Subjective:    Patient ID: Steven Lara, male    DOB: 08/26/1956, 55 y.o.   MRN: [id number]  HPI  CPX  Past Medical History: Reiter's Syndrome Dx remotely and again  12-2008 ( L  eye iritys  R foot problems )( Dr Kellie Simmering and ophthalmology) Allergic rhinitis insomnia colonoscopy   Date:  02/07/2007   Next Due:  02/2012   Results:  Adenomatous Polyp  (Dr Marina Goodell)  [facility]-- sees dermatology  Past Surgical History: no  Family History: Dementia-- F MI--no DM--no colon ca-- no prostate ca-- no  (apparently F had BPH)  Social History: Occupation: Naval architect Married with 2 children Never Smoked Alcohol use-yes-- rarely  diet-- exercise-->   Exercise  twice a week, eating better, recent wt loss   Review of Systems  Constitutional: Negative for fever and fatigue.  Cardiovascular: Negative for chest pain and leg swelling.  Gastrointestinal: Negative for abdominal pain and blood in stool.  Genitourinary: Negative for dysuria and hematuria.  Psychiatric/Behavioral:       No depression or anxiety        Objective:   Physical Exam  General:  alert and well-developed.   Neck-- no thyromegaly, nl carotid pulses Lungs:  normal respiratory effort, no intercostal retractions, no accessory muscle use, and normal breath sounds.   Heart:  normal rate, regular rhythm, and no murmur.   Abdomen:  soft, non-tender, no distention, no masses, no guarding, and no rigidity.   Rectal:  ext hemorrhoids  noted. Normal sphincter tone. No rectal masses or tenderness. Prostate:  Prostate gland firm and smooth, no enlargement, nodularity, tenderness, mass, asymmetry or induration. Extremities:  no pretibial edema bilaterally  Psych:  Oriented X3, memory intact for recent and remote, normally interactive, good eye contact, not anxious appearing, and not depressed appearing.        Assessment & Plan:

## 2011-12-15 ENCOUNTER — Other Ambulatory Visit: Payer: Self-pay | Admitting: Dermatology

## 2011-12-17 ENCOUNTER — Other Ambulatory Visit (INDEPENDENT_AMBULATORY_CARE_PROVIDER_SITE_OTHER): Payer: PRIVATE HEALTH INSURANCE

## 2011-12-17 DIAGNOSIS — Z0000 Encounter for general adult medical examination without abnormal findings: Secondary | ICD-10-CM

## 2011-12-17 LAB — LIPID PANEL
HDL: 44.8 mg/dL (ref >39.00)
Total CHOL/HDL Ratio: 4
Triglycerides: 97 mg/dL (ref 0.0–149.0)

## 2011-12-21 ENCOUNTER — Encounter: Payer: Self-pay | Admitting: *Deleted

## 2012-01-28 ENCOUNTER — Encounter: Payer: Self-pay | Admitting: Internal Medicine

## 2012-02-01 ENCOUNTER — Encounter: Payer: Self-pay | Admitting: Internal Medicine

## 2012-02-01 ENCOUNTER — Ambulatory Visit (INDEPENDENT_AMBULATORY_CARE_PROVIDER_SITE_OTHER): Payer: PRIVATE HEALTH INSURANCE | Admitting: Internal Medicine

## 2012-02-01 VITALS — BP 122/76 | HR 102 | Temp 98.1°F | Wt 217.0 lb

## 2012-02-01 DIAGNOSIS — J309 Allergic rhinitis, unspecified: Secondary | ICD-10-CM

## 2012-02-01 MED ORDER — AZITHROMYCIN 250 MG PO TABS: ORAL_TABLET | ORAL | Status: AC

## 2012-02-01 MED ORDER — FLUTICASONE PROPIONATE 50 MCG/ACT NA SUSP: 2.0000 | Freq: Every day | NASAL | Status: DC

## 2012-02-01 NOTE — Progress Notes (Signed)
  Subjective:    Patient ID: Steven Lara, male    DOB: 1956-01-01, 56 y.o.   MRN: [id number]  HPI Acute visit 3 days history of head and chest congestion, itchy watery eyes, sneezing, some headaches ear aches. She also has cough with yellow sputum. Unfortunately, his wife has cancer, will start chemotherapy this week, he is concerned that whatever infection he has made pass it to her.  Past Medical History:  Reiter's Syndrome Dx remotely and again 12-2008 ( L eye iritys R foot problems )( Dr Kellie Simmering and ophthalmology)  Allergic rhinitis  insomnia  colonoscopy Date: 02/07/2007 Next Due: 02/2012 Results: Adenomatous Polyp (Dr Marina Goodell)  [facility]-- sees dermatology  Past Surgical History:  no  Family History:  Dementia-- F  MI--no  DM--no  colon ca-- no  prostate ca-- no (apparently F had BPH)  Social History:  Occupation: Naval architect  Married , wife dx w/ rectal cancer 2013 with 2 children  Never Smoked  Alcohol use-yes-- rarely    Review of Systems Denies fever or chills, mild sore throat Some difficulty sleeping due to  the symptoms   Objective:   Physical Exam General -- alert, well-developed. No apparent distress.  HEENT -- TMs bulge, no red, no d/c; throat w/o redness, face symmetric and not tender to palpation, nose  Congested  Lungs -- normal respiratory effort, no intercostal retractions, no accessory muscle use, and normal breath sounds.   Heart-- normal rate, regular rhythm, no murmur, and no gallop.   Extremities-- no pretibial edema bilaterally  Neurologic-- alert & oriented X3 and strength normal in all extremities. Psych-- Cognition and judgment appear intact. Alert and cooperative with normal attention span and concentration.  not anxious appearing and not depressed appearing.       Assessment & Plan:

## 2012-02-01 NOTE — Assessment & Plan Note (Addendum)
Patient presents with symptoms more consistent with allergies than a  URI however he is immunosuppressed, wife will start chemotherapy soon and will be immunosupressed as well. Plan: start Zithromax, see instructions

## 2012-02-01 NOTE — Patient Instructions (Addendum)
Rest, fluids , tylenol For cough, take Mucinex DM twice a day as needed  For congestion use flonase  nasal spray once a day until you feel better Cont zyrtec  Take the antibiotic as prescribed  (zithromax) Call if no better in few days Call anytime if the symptoms are severe, you have high fever

## 2012-05-10 ENCOUNTER — Other Ambulatory Visit: Payer: Self-pay | Admitting: Internal Medicine

## 2012-05-10 NOTE — Telephone Encounter (Signed)
Refilled Ambien, #30 and 5 refills

## 2012-05-10 NOTE — Telephone Encounter (Signed)
Ok to refill 

## 2012-08-16 ENCOUNTER — Ambulatory Visit (INDEPENDENT_AMBULATORY_CARE_PROVIDER_SITE_OTHER): Payer: PRIVATE HEALTH INSURANCE | Admitting: Internal Medicine

## 2012-08-16 VITALS — BP 132/76 | HR 93 | Temp 97.3°F | Wt 221.0 lb

## 2012-08-16 DIAGNOSIS — H6690 Otitis media, unspecified, unspecified ear: Secondary | ICD-10-CM

## 2012-08-16 MED ORDER — PREDNISONE 10 MG PO TABS: ORAL_TABLET | ORAL | Status: DC

## 2012-08-16 MED ORDER — AZITHROMYCIN 250 MG PO TABS: ORAL_TABLET | ORAL | Status: DC

## 2012-08-16 NOTE — Progress Notes (Signed)
  Subjective:    Patient ID: Steven Lara, male    DOB: [birth year], 56 y.o.   MRN: [id number]  HPI Acute visit Symptoms started 5 days ago with chest and sinus congestion, yesterday he developed bilateral ear ache, felt a lot of pressure better, having difficulty with hearing.  Past Medical History:   Reiter's Syndrome Dx remotely and again 12-2008 ( L eye iritys R foot problems )( Dr Kellie Simmering and ophthalmology)   Allergic rhinitis   insomnia   colonoscopy Date: 02/07/2007 Next Due: 02/2012 Results: Adenomatous Polyp (Dr Marina Goodell)   [facility]-- sees dermatology   Past Surgical History:   no   Family History:   Dementia-- F   MI--no   DM--no   colon ca-- no   prostate ca-- no (apparently F had BPH)   Social History:   Occupation: Naval architect   Married , wife dx w/ rectal cancer 2013-- going to [place] [employer] [facility]) with 2 children   Never Smoked   Alcohol use-yes-- rarely    Review of Systems No fever or chills,some sore throat. He does have nose discharge which is clear and occasional cough green yellow sputum. No shortness of breath.    Objective:   Physical Exam  General -- alert, well-developed, and overweight appearing. No apparent distress.  HEENT --  Nose congested, R>L TM bulging, redness and swelling. Air fluid level on the right. No discharge, canal normal. Throat with mild redness otherwise normal Lungs -- normal respiratory effort, no intercostal retractions, no accessory muscle use, and normal breath sounds.   Heart-- normal rate, regular rhythm, no murmur, and no gallop.   Psych-- Cognition and judgment appear intact. Alert and cooperative with normal attention span and concentration.  not anxious appearing and not depressed appearing.      Assessment & Plan:     Bilateral otitis media in the setting of a URI.  Patient is immunosuppressed. Plan: Flonase, low dose prednisone. Amoxicillin may interfere with methotrexate, will use Zithromax. If he's not  responding well, may need a second round of antibiotics. Patient is quite concerned about the decreased hearing, I think that once we cleared the infection/inflammation from the ears, he should go back to normal, if not may need ENT referral.

## 2012-08-16 NOTE — Patient Instructions (Addendum)
Take prednisone for 5 days Take Zithromax as prescribed Use Flonase consistently every day for days to 2 weeks. Mucinex DM as needed if cough Call if you're not  improving in the next few days.

## 2012-08-17 ENCOUNTER — Encounter: Payer: Self-pay | Admitting: Internal Medicine

## 2012-08-24 ENCOUNTER — Encounter: Payer: Self-pay | Admitting: Internal Medicine

## 2012-08-24 ENCOUNTER — Ambulatory Visit (INDEPENDENT_AMBULATORY_CARE_PROVIDER_SITE_OTHER): Payer: PRIVATE HEALTH INSURANCE | Admitting: Internal Medicine

## 2012-08-24 VITALS — BP 112/74 | HR 85 | Temp 97.5°F | Wt 223.0 lb

## 2012-08-24 DIAGNOSIS — H6690 Otitis media, unspecified, unspecified ear: Secondary | ICD-10-CM

## 2012-08-24 MED ORDER — CLINDAMYCIN HCL 150 MG PO CAPS: 300.0000 mg | ORAL_CAPSULE | Freq: Three times a day (TID) | ORAL | Status: DC

## 2012-08-24 NOTE — Progress Notes (Signed)
  Subjective:    Patient ID: Steven Lara, male    DOB: 1956-06-23, 56 y.o.   MRN: [id number]  HPI Acute visit Since the last time he was here, he is taking the medications as prescribed. In general he is better but he still has some fatigue, feeling rundown. His wife is a cancer patient, has leukopenia, he is concerned about that.  Past Medical History:   Reiter's Syndrome Dx remotely and again 12-2008 ( L eye iritys R foot problems )( Dr Kellie Simmering and ophthalmology)   Allergic rhinitis   insomnia   colonoscopy Date: 02/07/2007 Next Due: 02/2012 Results: Adenomatous Polyp (Dr Marina Goodell)   [facility]-- sees dermatology   Past Surgical History:   no   Family History:   Dementia-- F   MI--no   DM--no   colon ca-- no   prostate ca-- no (apparently F had BPH)   Social History:   Occupation: Naval architect   Married , wife dx w/ rectal cancer 2013-- going to [place] [employer] [facility]) 2 children   Never Smoked   Alcohol use-yes-- rarely    Review of Systems No ear pain in the last 2 days, no ear discharge. Denies fever or chills Still has some chest congestion, occasional yellow sputum. Hearing is essentially back to normal.    Objective:   Physical Exam General -- alert, well-developed HEENT -- TMs  better, no bulging, no redness. If anything the TMs are slightly retracted. Canal is normal. Throat without redness, nose is slightly congested, face is nontender to palpation. Lungs -- normal respiratory effort, no intercostal retractions, no accessory muscle use, and normal breath sounds.   Heart-- normal rate, regular rhythm, no murmur, and no gallop.    Psych-- Cognition and judgment appear intact. Alert and cooperative with normal attention span and concentration.  not anxious appearing and not depressed appearing.      Assessment & Plan:   Bilateral otitis media in the setting of a URI.  Improving, patient is concerned about his wife having neutropenia, I doubt he is contagious  nevertheless cough precautions discussed. On the other hand, he is not completely well and he is immunosuppressed. He takes Imuran and methotrexate, many antibiotics interact w/ such medicines except Avelox and clindamycin. Plan is to give him a second round of abx w/ clindamycin for 5 days. I prefer to avoid Avelox for now, b/c he may need it at some point if he has a   more serious infection. See instructions

## 2012-08-24 NOTE — Patient Instructions (Addendum)
Take clinda  as prescribed Continue  Flonase consistently every day until better. Mucinex DM as needed if cough Call if you don't continue improving

## 2012-08-25 ENCOUNTER — Telehealth: Payer: Self-pay

## 2012-08-25 MED ORDER — MOXIFLOXACIN HCL 400 MG PO TABS: 400.0000 mg | ORAL_TABLET | Freq: Every day | ORAL | Status: DC

## 2012-08-25 NOTE — Telephone Encounter (Signed)
Refill done.  

## 2012-08-25 NOTE — Telephone Encounter (Signed)
Pt LMOVM states antibiotic Cleocin Paz prescribed at OV 08/24/12 making him feel nauseous need another med sent. PLz advise pt  ZO:[id number] MW

## 2012-08-25 NOTE — Telephone Encounter (Signed)
avelox 500 1 po qd #5 , no RF

## 2012-11-02 ENCOUNTER — Encounter: Payer: Self-pay | Admitting: *Deleted

## 2012-11-02 ENCOUNTER — Other Ambulatory Visit: Payer: Self-pay | Admitting: Internal Medicine

## 2012-11-02 NOTE — Telephone Encounter (Signed)
Ok to refill? Last OV 11.20.13 Last filled 8.6.13

## 2012-11-02 NOTE — Telephone Encounter (Signed)
Pt made aware rx & controlled substance contract is ready to be picked up at front desk.  

## 2012-11-02 NOTE — Telephone Encounter (Signed)
done

## 2012-12-01 ENCOUNTER — Encounter: Payer: Self-pay | Admitting: Internal Medicine

## 2012-12-20 ENCOUNTER — Other Ambulatory Visit: Payer: Self-pay | Admitting: Dermatology

## 2013-01-10 ENCOUNTER — Encounter: Payer: Self-pay | Admitting: Internal Medicine

## 2013-02-07 ENCOUNTER — Other Ambulatory Visit: Payer: Self-pay | Admitting: Dermatology

## 2013-03-06 ENCOUNTER — Telehealth: Payer: Self-pay | Admitting: Internal Medicine

## 2013-03-06 NOTE — Telephone Encounter (Signed)
Ok to refill? Last OV 11.20.13 Last filled 1.29.14

## 2013-03-07 ENCOUNTER — Encounter: Payer: Self-pay | Admitting: *Deleted

## 2013-03-07 NOTE — Telephone Encounter (Signed)
Pt made aware he is due for a CPE.  Refill faxed.

## 2013-03-07 NOTE — Telephone Encounter (Signed)
RF done. Please advice patient is due for a physical at his convenience.

## 2013-06-06 ENCOUNTER — Other Ambulatory Visit: Payer: Self-pay | Admitting: Internal Medicine

## 2013-06-06 NOTE — Telephone Encounter (Signed)
Ok to refill? Last OV 11.20.13 Last filled 6.2.14

## 2013-06-06 NOTE — Telephone Encounter (Signed)
Done, advise pt he is due for a visit

## 2013-06-16 ENCOUNTER — Encounter: Payer: Self-pay | Admitting: Internal Medicine

## 2013-06-16 ENCOUNTER — Ambulatory Visit (INDEPENDENT_AMBULATORY_CARE_PROVIDER_SITE_OTHER): Payer: PRIVATE HEALTH INSURANCE | Admitting: Internal Medicine

## 2013-06-16 VITALS — BP 124/81 | HR 69 | Temp 98.4°F | Wt 233.6 lb

## 2013-06-16 DIAGNOSIS — M0230 Reiter's disease, unspecified site: Secondary | ICD-10-CM

## 2013-06-16 MED ORDER — DOXYCYCLINE HYCLATE 100 MG PO TABS: 100.0000 mg | ORAL_TABLET | Freq: Two times a day (BID) | ORAL | Status: DC

## 2013-06-16 NOTE — Progress Notes (Signed)
  Subjective:    Patient ID: Steven Lara, male    DOB: 1956-03-07, 57 y.o.   MRN: [id number]  HPI Acute visit One-week history of urinary frequency and discomfort, he has a history of Reiter's syndrome, thinks related to that condition. He has some clear or urethral discharge, no genital ulcers. He is taking his reiters medication consistently.  Past Medical History  Diagnosis Date  . Allergic rhinitis   . Insomnia   . Reiter's syndrome     dx remotely and again 12/2008 (L eye iritys R foot problems)  . Mole (skin)     sees derm  . Diverticulosis   . Colon polyps     02/07/2007 Next Due: 02/2012 Results: Adenomatous Polyp (Dr Marina Goodell)    No past surgical history on file. History   Social History  . Marital Status: Married    Spouse Name: N/A    Number of Children: 2  . Years of Education: N/A   Occupational History  . Secondary school teacher     Social History Main Topics  . Smoking status: Never Smoker   . Smokeless tobacco: Never Used  . Alcohol Use: Yes     Comment: socially  . Drug Use: Not on file  . Sexual Activity: Not on file   Other Topics Concern  . Not on file   Social History Narrative   Married , wife dx w/ rectal cancer 2013-- going to [place] [facility]) ; as of 06-2013  she is  Stable on chemo        Review of Systems Denies fever or chills Currently no arthralgias or conjunctivitis. No nausea, vomiting, diarrhea. No difficulty urinating or gross hematuria    Objective:   Physical Exam BP 124/81  Pulse 69  Temp(Src) 98.4 F (36.9 C)  Wt 233 lb 9.6 oz (105.96 kg)  BMI 35 kg/m2  SpO2 97%  General -- alert, well-developed, NAD, well appearing gentleman.  No CVA tenderness GU-- Scrotal contents normal, penis without lesions, urethra with small amount of white-transparent discharge  Extremities-- no pretibial edema bilaterally  Neurologic-- alert & oriented X3. Speech, gait normal.   Psych-- Cognition and judgment appear intact. Alert and  cooperative with normal attention span and concentration. not anxious appearing and not depressed appearing.      Assessment & Plan:

## 2013-06-16 NOTE — Assessment & Plan Note (Signed)
Findings consistent with urethritis, likely related to Reiter'ssyndrome. Will get a G&C, urine culture, UA. Start empiric doxycycline. See instructions

## 2013-06-16 NOTE — Patient Instructions (Signed)
Take the medicines as prescribed Call if no better in few days, call anytime if no better Let Dr Kellie Simmering know about your symptoms

## 2013-06-17 ENCOUNTER — Encounter: Payer: Self-pay | Admitting: Internal Medicine

## 2013-06-17 LAB — URINALYSIS, ROUTINE W REFLEX MICROSCOPIC
Bilirubin Urine: NEGATIVE
Glucose, UA: NEGATIVE mg/dL
Specific Gravity, Urine: 1.013 (ref 1.005–1.030)

## 2013-06-17 LAB — URINALYSIS, MICROSCOPIC ONLY
Crystals: NONE SEEN
Squamous Epithelial / LPF: NONE SEEN

## 2013-06-19 ENCOUNTER — Telehealth: Payer: Self-pay | Admitting: *Deleted

## 2013-06-19 ENCOUNTER — Other Ambulatory Visit: Payer: Self-pay | Admitting: Internal Medicine

## 2013-06-19 MED ORDER — INDOMETHACIN 25 MG PO CAPS: 25.0000 mg | ORAL_CAPSULE | Freq: Three times a day (TID) | ORAL | Status: DC | PRN

## 2013-06-19 NOTE — Telephone Encounter (Signed)
Pt notified via tele. DJR  

## 2013-06-19 NOTE — Telephone Encounter (Signed)
Message copied by Eustace Quail on Mon Jun 19, 2013 11:25 AM ------      Message from: Willow Ora E      Created: Mon Jun 19, 2013 10:16 AM       Call pt:      All labs ok, stop Abx , take indomethacin as needed (I already sent the Rx), take indomethacin always with food, stop it if side effects such as nausea or stomach pain ------

## 2013-06-27 ENCOUNTER — Telehealth: Payer: Self-pay | Admitting: *Deleted

## 2013-06-27 NOTE — Telephone Encounter (Signed)
Dr Paz's patient 

## 2013-06-27 NOTE — Telephone Encounter (Signed)
Triage line messge: Patient was seen on 9.12.2014 for urinary discomfort. He states that he has finished medication and he is still experiencing dysuria and urinary tract inflammation. He also states that he believes he has developed prostate inflammation. He would like to know if a referral to [facility] would be the appropriate next step? Please advise.

## 2013-06-28 ENCOUNTER — Telehealth: Payer: Self-pay | Admitting: *Deleted

## 2013-06-28 DIAGNOSIS — N419 Inflammatory disease of prostate, unspecified: Secondary | ICD-10-CM

## 2013-06-28 MED ORDER — DOXYCYCLINE HYCLATE 100 MG PO TABS: 100.0000 mg | ORAL_TABLET | Freq: Two times a day (BID) | ORAL | Status: DC

## 2013-06-28 NOTE — Telephone Encounter (Signed)
Patient is calling back to check the status of his request. He can be reached at [phone].

## 2013-06-28 NOTE — Telephone Encounter (Signed)
1.Yes, please arrange referral , dx ?prostatitis 2. To have 10 more days of abx: RF doxycycline x 10 days 3.Also advise pt: if sx increase, has difficulty urinating, fever, chills: needs to let us know

## 2013-06-28 NOTE — Telephone Encounter (Signed)
Pt notified that referral was put in and should be contacted soon, continue Doxycycline. To let us know if his sx get worse. DJR

## 2013-06-28 NOTE — Telephone Encounter (Signed)
error 

## 2013-06-30 ENCOUNTER — Telehealth: Payer: Self-pay | Admitting: Internal Medicine

## 2013-06-30 NOTE — Telephone Encounter (Signed)
Patient states that the Urologist we referred him to was not in his insurance network. He is calling to request that we send his urology referral to [facility] in [facility]. Please advise.

## 2013-06-30 NOTE — Telephone Encounter (Signed)
Referral request to [facility] urology in [place]. Pt states urology that was put in is not covered by his insurance. DJR

## 2013-07-03 ENCOUNTER — Telehealth: Payer: Self-pay | Admitting: *Deleted

## 2013-07-03 NOTE — Telephone Encounter (Signed)
Patient called and stated that his antibiotic doxycycline (VIBRA-TABS) 100 MG tablet runs out today and he is still having symptoms. He also stated that he is awaiting his appoint for urology. The appt that was made on Friday was out of network.

## 2013-07-03 NOTE — Telephone Encounter (Signed)
He already had antibiotics for approximately 2 weeks, I don't believe he needs more antibiotics at this point. He does need to see urology.

## 2013-07-06 ENCOUNTER — Other Ambulatory Visit: Payer: Self-pay | Admitting: Internal Medicine

## 2013-07-06 NOTE — Telephone Encounter (Signed)
Referral faxed to [facility]. Appt made.

## 2013-07-07 ENCOUNTER — Telehealth: Payer: Self-pay | Admitting: *Deleted

## 2013-07-07 MED ORDER — ZOLPIDEM TARTRATE 10 MG PO TABS: ORAL_TABLET | ORAL | Status: DC

## 2013-07-07 NOTE — Telephone Encounter (Signed)
Ok RF 

## 2013-07-07 NOTE — Telephone Encounter (Signed)
rx refilled per Dr.Paz order. DJR

## 2013-07-07 NOTE — Telephone Encounter (Signed)
zolpidem (AMBIEN) 10 MG tablet Last OV: 06/16/2013 Last Refill: 06/06/2013 Last UDS: 11/02/2012  Low Risk

## 2013-07-13 ENCOUNTER — Telehealth: Payer: Self-pay

## 2013-07-13 NOTE — Telephone Encounter (Signed)
Medication List and allergies: done  Pharmacy updated, uses [facility] for 90 day supply [employer], uses Engineering geologist local prescriptions  HM UTD: UTD  Immunizations due: admin flu vaccine upon arrival  A/P:  LAST: HM due: PSA: UTD 12/2011 WNL  CCS: UTD 02/2007   To Discuss with Provider: Nothing at this time.

## 2013-07-14 ENCOUNTER — Ambulatory Visit (INDEPENDENT_AMBULATORY_CARE_PROVIDER_SITE_OTHER): Payer: PRIVATE HEALTH INSURANCE | Admitting: Internal Medicine

## 2013-07-14 ENCOUNTER — Encounter: Payer: Self-pay | Admitting: Internal Medicine

## 2013-07-14 VITALS — BP 121/79 | HR 69 | Temp 98.2°F | Ht 67.7 in | Wt 226.0 lb

## 2013-07-14 DIAGNOSIS — Z0000 Encounter for general adult medical examination without abnormal findings: Secondary | ICD-10-CM

## 2013-07-14 DIAGNOSIS — R399 Unspecified symptoms and signs involving the genitourinary system: Secondary | ICD-10-CM

## 2013-07-14 DIAGNOSIS — K219 Gastro-esophageal reflux disease without esophagitis: Secondary | ICD-10-CM

## 2013-07-14 DIAGNOSIS — Z23 Encounter for immunization: Secondary | ICD-10-CM

## 2013-07-14 HISTORY — DX: Gastro-esophageal reflux disease without esophagitis: K21.9

## 2013-07-14 MED ORDER — OMEPRAZOLE 20 MG PO CPDR: 20.0000 mg | DELAYED_RELEASE_CAPSULE | Freq: Every day | ORAL | Status: DC

## 2013-07-14 NOTE — Assessment & Plan Note (Signed)
Having frequent GERD symptoms, using tums daily. Risk of complications such as Barrett's esophagus discussed, recommend Prilosec daily, prescription sent. Diet also discussed.

## 2013-07-14 NOTE — Patient Instructions (Signed)
Schedule labs for next week, fasting: CMP, CBC, PSA, TSH, FLP-- dx V70  Keep the appointment to see the urologist, call if symptoms severe  Next visit in one year and as needed    Diet for Gastroesophageal Reflux Disease, Adult Reflux (acid reflux) is when acid from your stomach flows up into the esophagus. When acid comes in contact with the esophagus, the acid causes irritation and soreness (inflammation) in the esophagus. When reflux happens often or so severely that it causes damage to the esophagus, it is called gastroesophageal reflux disease (GERD). Nutrition therapy can help ease the discomfort of GERD. FOODS OR DRINKS TO AVOID OR LIMIT  Smoking or chewing tobacco. Nicotine is one of the most potent stimulants to acid production in the gastrointestinal tract.  Caffeinated and decaffeinated coffee and black tea.  Regular or low-calorie carbonated beverages or energy drinks (caffeine-free carbonated beverages are allowed).   Strong spices, such as black pepper, white pepper, red pepper, cayenne, curry powder, and chili powder.  Peppermint or spearmint.  Chocolate.  High-fat foods, including meats and fried foods. Extra added fats including oils, butter, salad dressings, and nuts. Limit these to less than 8 tsp per day.  Fruits and vegetables if they are not tolerated, such as citrus fruits or tomatoes.  Alcohol.  Any food that seems to aggravate your condition. If you have questions regarding your diet, call your caregiver or a registered dietitian. OTHER THINGS THAT MAY HELP GERD INCLUDE:   Eating your meals slowly, in a relaxed setting.  Eating 5 to 6 small meals per day instead of 3 large meals.  Eliminating food for a period of time if it causes distress.  Not lying down until 3 hours after eating a meal.  Keeping the head of your bed raised 6 to 9 inches (15 to 23 cm) by using a foam wedge or blocks under the legs of the bed. Lying flat may make symptoms  worse.  Being physically active. Weight loss may be helpful in reducing reflux in overweight or obese adults.  Wear loose fitting clothing EXAMPLE MEAL PLAN This meal plan is approximately 2,000 calories based on https://www.bernard.org/ meal planning guidelines. Breakfast   cup cooked oatmeal.  1 cup strawberries.  1 cup low-fat milk.  1 oz almonds. Snack  1 cup cucumber slices.  6 oz yogurt (made from low-fat or fat-free milk). Lunch  2 slice whole-wheat bread.  2 oz sliced [place].  2 tsp mayonnaise.  1 cup blueberries.  1 cup snap peas. Snack  6 whole-wheat crackers.  1 oz string cheese. Dinner   cup brown rice.  1 cup mixed veggies.  1 tsp olive oil.  3 oz grilled fish. Document Released: 09/21/2005 Document Revised: 12/14/2011 Document Reviewed: 08/07/2011 [facility] Patient Information 2014 [place], [place].

## 2013-07-14 NOTE — Progress Notes (Signed)
  Subjective:    Patient ID: Steven Lara, male    DOB: 30-Jul-1956, 57 y.o.   MRN: [id number]  HPI CPX  Past Medical History  Diagnosis Date  . Allergic rhinitis   . Insomnia   . Reiter's syndrome     dx remotely and again 12/2008 (L eye iritys R foot problems)  . Mole (skin)     sees derm  . Diverticulosis   . Colon polyps     02/07/2007 Next Due: 02/2012 Results: Adenomatous Polyp (Dr Marina Goodell)    Past Surgical History  Procedure Laterality Date  . No past surgeries     History   Social History  . Marital Status: Married    Spouse Name: N/A    Number of Children: 2  . Years of Education: N/A   Occupational History  . Secondary school teacher     Social History Main Topics  . Smoking status: Never Smoker   . Smokeless tobacco: Never Used  . Alcohol Use: Yes     Comment: socially  . Drug Use: No  . Sexual Activity: Not on file   Other Topics Concern  . Not on file   Social History Narrative   Married , wife dx w/ rectal cancer 2013-- going to [facility]) ; as of 06-2013  she is  Stable on chemo       Family History  Problem Relation Age of Onset  . Dementia Father   . Heart attack Neg Hx   . Diabetes Neg Hx   . Colon cancer Neg Hx   . Prostate cancer Neg Hx     apparently F and BPH     Review of Systems Diet-- regular Exercise-- no recently No  CP, SOB, lower extremity edema Denies  nausea, vomiting diarrhea Denies  blood in the stools GERD  Sx-- depending on diet, requires tums most days (-) cough, sputum production (-) wheezing, chest congestion     Objective:   Physical Exam BP 121/79  Pulse 69  Temp(Src) 98.2 F (36.8 C)  Ht 5' 7.7" (1.72 m)  Wt 226 lb (102.513 kg)  BMI 34.65 kg/m2  SpO2 98%  General -- alert, well-developed, NAD.  Neck --no thyromegaly , normal carotid pulse Lungs -- normal respiratory effort, no intercostal retractions, no accessory muscle use, and normal breath sounds.  Heart-- normal rate, regular rhythm, no  murmur.  Abdomen-- Not distended, good bowel sounds,soft, non-tender. GU-- scrotal contents and penis wnl. No hernias. Rectal-- No external abnormalities noted. Normal sphincter tone. No rectal masses or tenderness. Brown stool, Hemoccult negative  Prostate--Prostate gland firm and smooth, no enlargement, nodularity, tenderness, mass, asymmetry or induration. Extremities-- no pretibial edema bilaterally  Neurologic--  alert & oriented X3. Speech normal, gait normal, strength normal in all extremities.  Psych-- Cognition and judgment appear intact. Cooperative with normal attention span and concentration. No anxious appearing , no depressed appearing.      Assessment & Plan:

## 2013-07-14 NOTE — Assessment & Plan Note (Addendum)
adacel 01-2007 Flu shot today PNM shot 2011  zostavaxa-- unable to get d/t meds he takes Discussed diet and exercise Colonoscopy (Dr Marina Goodell)  02/07/2007 , due for repreated Cscope, letter from GI reprinted  Labs including a PSA d/t urinary sx

## 2013-07-15 ENCOUNTER — Encounter: Payer: Self-pay | Admitting: Internal Medicine

## 2013-07-15 DIAGNOSIS — R399 Unspecified symptoms and signs involving the genitourinary system: Secondary | ICD-10-CM | POA: Insufficient documentation

## 2013-07-15 NOTE — Assessment & Plan Note (Signed)
Urinary symptoms Since the last time he was here, urine culture , UA and G&C were (-). Status post doxycycline for 2 weeks. he continue with symptoms although urinary frequencies is slightly decreased. DRE   not consistent with prostatitis. Plan: We'll see urology in approximately 10 days, recommend to keep the appointment.

## 2013-08-07 ENCOUNTER — Telehealth: Payer: Self-pay | Admitting: *Deleted

## 2013-08-07 ENCOUNTER — Other Ambulatory Visit: Payer: Self-pay | Admitting: Internal Medicine

## 2013-08-07 MED ORDER — ZOLPIDEM TARTRATE 10 MG PO TABS: ORAL_TABLET | ORAL | Status: DC

## 2013-08-07 NOTE — Telephone Encounter (Signed)
No need for UDS, he is only taking Ambien, okay to refill.

## 2013-08-07 NOTE — Telephone Encounter (Signed)
zolpidem (AMBIEN) 10 MG tablet Last refill: 07/07/2013, #30, 0 refills Last OV: 07/14/2013 Contract on file; UDS due

## 2013-08-07 NOTE — Addendum Note (Signed)
Addended by: Willow Ora E on: 08/07/2013 05:00 PM   Modules accepted: Orders

## 2013-08-08 ENCOUNTER — Other Ambulatory Visit: Payer: Self-pay | Admitting: *Deleted

## 2013-08-08 MED ORDER — ZOLPIDEM TARTRATE 10 MG PO TABS: ORAL_TABLET | ORAL | Status: DC

## 2013-08-08 NOTE — Telephone Encounter (Signed)
Ambien refilled

## 2013-08-15 ENCOUNTER — Encounter: Payer: Self-pay | Admitting: Internal Medicine

## 2014-02-01 ENCOUNTER — Other Ambulatory Visit: Payer: Self-pay | Admitting: Internal Medicine

## 2014-02-02 ENCOUNTER — Telehealth: Payer: Self-pay | Admitting: *Deleted

## 2014-02-02 MED ORDER — ZOLPIDEM TARTRATE 10 MG PO TABS: ORAL_TABLET | ORAL | Status: DC

## 2014-02-02 NOTE — Telephone Encounter (Signed)
rx faxed to [facility] [address].  

## 2014-02-02 NOTE — Telephone Encounter (Signed)
done

## 2014-02-02 NOTE — Telephone Encounter (Signed)
ambien 10 mg  Last OV- 07/14/13 Last refilled- 08/08/13 #30 / 5 rf

## 2014-08-03 ENCOUNTER — Other Ambulatory Visit: Payer: Self-pay | Admitting: Internal Medicine

## 2014-08-07 ENCOUNTER — Telehealth: Payer: Self-pay | Admitting: Internal Medicine

## 2014-08-07 NOTE — Telephone Encounter (Signed)
Pt is requesting refill on Ambien. He has medication F/U scheduled for tomorrow.  Last OV: 07/14/2013 Last Fill: 02/02/2014 #30 5 RF UDS: None   Please advise.

## 2014-08-07 NOTE — Telephone Encounter (Signed)
Caller name: [place] \\Relation  to pt: self  Call back number: [phone] Pharmacy: [facility] [address]  Reason for call:   Patient has appointment for tomorrow and wanted to know if dr. Larose Kells would refill zolpidem

## 2014-08-08 ENCOUNTER — Ambulatory Visit (INDEPENDENT_AMBULATORY_CARE_PROVIDER_SITE_OTHER): Payer: PRIVATE HEALTH INSURANCE | Admitting: Internal Medicine

## 2014-08-08 ENCOUNTER — Encounter: Payer: Self-pay | Admitting: Internal Medicine

## 2014-08-08 VITALS — BP 125/77 | HR 73 | Temp 97.5°F | Wt 218.2 lb

## 2014-08-08 DIAGNOSIS — M778 Other enthesopathies, not elsewhere classified: Secondary | ICD-10-CM

## 2014-08-08 DIAGNOSIS — M7582 Other shoulder lesions, left shoulder: Secondary | ICD-10-CM

## 2014-08-08 DIAGNOSIS — G4700 Insomnia, unspecified: Secondary | ICD-10-CM

## 2014-08-08 MED ORDER — PREDNISONE 10 MG PO TABS: ORAL_TABLET | ORAL | Status: DC

## 2014-08-08 MED ORDER — ZOLPIDEM TARTRATE 10 MG PO TABS: ORAL_TABLET | ORAL | Status: DC

## 2014-08-08 NOTE — Assessment & Plan Note (Addendum)
Lost his wife 5 months ago, counseled, fortunately he is doing okay. Continue with Ambien for now, a prescription was printed yesterday and an additional one printed today

## 2014-08-08 NOTE — Telephone Encounter (Signed)
Faxed to [facility]

## 2014-08-08 NOTE — Telephone Encounter (Signed)
done

## 2014-08-08 NOTE — Patient Instructions (Addendum)
Take prednisone as prescribed  Get your flu shot in 2-3 weeks  Please come back to the office in 4-6 months  for a physical exam. Come back fasting

## 2014-08-08 NOTE — Progress Notes (Signed)
Pre visit review using our clinic review tool, if applicable. No additional management support is needed unless otherwise documented below in the visit note. 

## 2014-08-08 NOTE — Progress Notes (Signed)
Subjective:    Patient ID: Steven Lara, male    DOB: [birth year], 58 y.o.   MRN: [id number]  DOS:  08/08/2014 Type of visit - description : rov Interval history: Insomnia, needs a refill on Ambien, it works well for him. Few days history of left shoulder pain, anteriorly, worse with arm motion.   ROS Denies any shoulder injury, not overdoing with his arms. History of GERD, asymptomatic Lost  his wife 5 months ago, doing okay for the circumstances. No depression per se,  Dealing w/ the issue in a healthy way .  Past Medical History  Diagnosis Date  . Allergic rhinitis   . Insomnia   . Reiter's syndrome     dx remotely and again 12/2008 (L eye iritys R foot problems)  . Mole (skin)     sees derm  . Diverticulosis   . Colon polyps     02/07/2007 Next Due: 02/2012 Results: Adenomatous Polyp (Dr Henrene Pastor)   . GERD (gastroesophageal reflux disease) 07/14/2013    Past Surgical History  Procedure Laterality Date  . No past surgeries      History   Social History  . Marital Status: Married    Spouse Name: N/A    Number of Children: 2  . Years of Education: N/A   Occupational History  . Therapist, music     Social History Main Topics  . Smoking status: Never Smoker   . Smokeless tobacco: Never Used  . Alcohol Use: Yes     Comment: socially  . Drug Use: No  . Sexual Activity: Not on file   Other Topics Concern  . Not on file   Social History Narrative   Married , wife dx w/ rectal cancer 2013-- going to [facility]) ; lost wife ~ 03-2014        Medication List       This list is accurate as of: 08/08/14 11:59 PM.  Always use your most recent med list.               azaTHIOprine 50 MG tablet  Commonly known as:  IMURAN  Take 100 mg by mouth daily.     fluticasone 50 MCG/ACT nasal spray  Commonly known as:  FLONASE  Place 2 sprays into the nose daily.     folic acid 1 MG tablet  Commonly known as:  FOLVITE  Take 1 mg by mouth daily.     indomethacin 25 MG capsule  Commonly known as:  INDOCIN  Take 1 capsule (25 mg total) by mouth 3 (three) times daily with meals as needed.     methotrexate 2.5 MG tablet  Commonly known as:  RHEUMATREX  Caution:Chemotherapy. Protect from light.4 tabs by mouth two days a week.     omeprazole 20 MG capsule  Commonly known as:  PRILOSEC  Take 1 capsule (20 mg total) by mouth daily.     PRED FORTE OP  Apply to eye as needed.     predniSONE 10 MG tablet  Commonly known as:  DELTASONE  4 tablets x 2 days, 3 tabs x 2 days, 2 tabs x 2 days, 1 tab x 2 days     zolpidem 10 MG tablet  Commonly known as:  AMBIEN  TAKE 1 TABLET BY MOUTH EVERY NIGHT AT BEDTIME AS NEEDED FOR SLEEP     ZYRTEC ALLERGY 10 MG tablet  Generic drug:  cetirizine  Take 10 mg by mouth daily.  Objective:   Physical Exam BP 125/77 mmHg  Pulse 73  Temp(Src) 97.5 F (36.4 C) (Oral)  Wt 218 lb 4 oz (98.998 kg)  SpO2 97%  General -- alert, well-developed, NAD.  Neck --normal ROM Extremities--  R shoulder wnl L shoulder: mild discomfort anteriorly range of motion. Pain is located at the bicipital tendon area, no TTP, no swelling Neurologic--  alert & oriented X3. Speech normal, gait appropriate for age, strength symmetric and appropriate for age.  Psych-- Cognition and judgment appear intact. Cooperative with normal attention span and concentration. No anxious or depressed appearing.     Assessment & Plan:   Shoulder pain, probably bicipital tendinitis. We discussed options, we agreed to do a prednisone taper, if not better will discuss with rheumatology. Local injection?  Because he is getting prednisone, recommend to get the flu shot in 2 or 3 weeks

## 2014-10-08 ENCOUNTER — Telehealth: Payer: Self-pay

## 2014-10-08 NOTE — Telephone Encounter (Signed)
Steven Lara [phone] [place] called to check on refill for his zolpidem (AMBIEN) 10 MG tablet , he is completely out.

## 2014-10-08 NOTE — Telephone Encounter (Signed)
Call pharmacy, he got a rx with 4 RF, use refills , does not need a new Rx

## 2014-10-08 NOTE — Telephone Encounter (Signed)
Waiting authorization from Dr. Larose Kells. Controlled substances can take 24 to 48 hours to be approved by provider.

## 2014-10-08 NOTE — Telephone Encounter (Signed)
Pt is requesting refill on Ambien.  Last OV: 08/08/2014 Last Fill: 08/08/2014 # 30 and 4 RFs Pharmacy states on request that Pt is completely out of refills.  Please advise.

## 2014-10-09 NOTE — Telephone Encounter (Signed)
[facility] pharmacy notified, Pt needs to use refills that were given on 08/08/2014.

## 2014-10-10 NOTE — Telephone Encounter (Signed)
[facility] # [phone]

## 2014-10-10 NOTE — Telephone Encounter (Signed)
See below. Please advise.  

## 2014-10-10 NOTE — Telephone Encounter (Signed)
Please print a new prescription, #30 and 4 refills

## 2014-10-10 NOTE — Telephone Encounter (Signed)
Patient states that he accidentally threw that [place] rx that was given to him on 08/08/14 away.

## 2014-10-11 MED ORDER — ZOLPIDEM TARTRATE 10 MG PO TABS: ORAL_TABLET | ORAL | Status: DC

## 2014-10-11 NOTE — Telephone Encounter (Signed)
Printed awaiting signature by Dr. Larose Kells.

## 2014-10-11 NOTE — Addendum Note (Signed)
Addended by: Wilfrid Lund on: 10/11/2014 08:02 AM   Modules accepted: Orders

## 2014-10-11 NOTE — Telephone Encounter (Signed)
Faxed to [facility]

## 2014-12-06 ENCOUNTER — Ambulatory Visit
Admission: RE | Admit: 2014-12-06 | Discharge: 2014-12-06 | Disposition: A | Discharge status: Home / Self Care | Payer: PRIVATE HEALTH INSURANCE | Source: Ambulatory Visit | Attending: Rheumatology | Admitting: Rheumatology

## 2014-12-06 ENCOUNTER — Other Ambulatory Visit: Payer: Self-pay | Admitting: Rheumatology

## 2014-12-06 DIAGNOSIS — M5489 Other dorsalgia: Secondary | ICD-10-CM

## 2014-12-06 DIAGNOSIS — M542 Cervicalgia: Secondary | ICD-10-CM

## 2015-01-18 ENCOUNTER — Telehealth: Payer: Self-pay | Admitting: Internal Medicine

## 2015-01-18 NOTE — Telephone Encounter (Signed)
Pre Visit letter sent  °

## 2015-02-06 ENCOUNTER — Encounter: Payer: Self-pay | Admitting: *Deleted

## 2015-02-06 ENCOUNTER — Telehealth: Payer: Self-pay | Admitting: *Deleted

## 2015-02-06 NOTE — Telephone Encounter (Signed)
Pre-Visit Call completed with patient and chart updated.   Pre-Visit Info documented in Specialty Comments under Snap[employer].    

## 2015-02-07 ENCOUNTER — Ambulatory Visit (INDEPENDENT_AMBULATORY_CARE_PROVIDER_SITE_OTHER): Payer: No Typology Code available for payment source | Admitting: Internal Medicine

## 2015-02-07 ENCOUNTER — Encounter: Payer: Self-pay | Admitting: Internal Medicine

## 2015-02-07 VITALS — BP 118/68 | HR 66 | Temp 97.8°F | Ht 68.0 in | Wt 224.4 lb

## 2015-02-07 DIAGNOSIS — G4700 Insomnia, unspecified: Secondary | ICD-10-CM

## 2015-02-07 DIAGNOSIS — K219 Gastro-esophageal reflux disease without esophagitis: Secondary | ICD-10-CM

## 2015-02-07 DIAGNOSIS — Z0000 Encounter for general adult medical examination without abnormal findings: Secondary | ICD-10-CM

## 2015-02-07 DIAGNOSIS — Z23 Encounter for immunization: Secondary | ICD-10-CM

## 2015-02-07 DIAGNOSIS — M0230 Reiter's disease, unspecified site: Secondary | ICD-10-CM

## 2015-02-07 LAB — LIPID PANEL
Cholesterol: 179 mg/dL (ref 0–200)
HDL: 45.3 mg/dL (ref >39.00)
LDL Cholesterol: 113 mg/dL — ABNORMAL HIGH (ref 0–99)
NONHDL: 133.7
Total CHOL/HDL Ratio: 4
Triglycerides: 104 mg/dL (ref 0.0–149.0)
VLDL: 20.8 mg/dL (ref 0.0–40.0)

## 2015-02-07 LAB — TSH: TSH: 3.34 u[IU]/mL (ref 0.35–4.50)

## 2015-02-07 LAB — PSA: PSA: 1.12 ng/mL (ref 0.10–4.00)

## 2015-02-07 NOTE — Progress Notes (Signed)
Subjective:    Patient ID: Steven Lara, male    DOB: 12-06-1955, 59 y.o.   MRN: [id number]  DOS:  02/07/2015 Type of visit - description : cpx Interval history: Sees Dr. Charlestine Night for Reiter's syndrome and get labs regularly to  monitor his medications. Loss his wife last year, he feels he is dealing with his loss in a healthy way.     Review of Systems  Constitutional: No fever, chills. No unexplained wt changes. No unusual sweats HEENT: No dental problems, ear discharge, facial swelling, voice changes. No eye discharge, redness or intolerance to light Respiratory: No wheezing or difficulty breathing. No cough , mucus production Cardiovascular: No CP, leg swelling or palpitations GI: no nausea, vomiting, diarrhea or abdominal pain.  No blood in the stools. No dysphagia   Endocrine: No polyphagia, polyuria or polydipsia GU: occ dysuria , felt to be due to Reiter"s, no gross hematuria, difficulty urinating. No urinary urgency or frequency. Musculoskeletal:  Occasional pains related to Reiter's Skin: No change in the color of the skin, palor or rash Allergic, immunologic: No environmental allergies or food allergies Neurological: No dizziness or syncope. No headaches. No diplopia, slurred speech, motor deficits, facial numbness Hematological: No enlarged lymph nodes, easy bruising or bleeding Psychiatry: No suicidal ideas, hallucinations, behavior problems or confusion. No unusual/severe anxiety or depression.     Past Medical History  Diagnosis Date  . Allergic rhinitis   . Insomnia   . Reiter's syndrome     dx remotely and again 12/2008 (L eye iritys R foot problems)  . Mole (skin)     sees derm  . Diverticulosis   . Colon polyps     02/07/2007 Next Due: 02/2012 Results: Adenomatous Polyp (Dr Henrene Pastor)   . GERD (gastroesophageal reflux disease) 07/14/2013  . Allergy     seasonal    Past Surgical History  Procedure Laterality Date  . No past surgeries      History    Social History  . Marital Status: Married    Spouse Name: N/A  . Number of Children: 2  . Years of Education: N/A   Occupational History  . Therapist, music     Social History Main Topics  . Smoking status: Never Smoker   . Smokeless tobacco: Never Used  . Alcohol Use: Yes     Comment: socially  . Drug Use: No  . Sexual Activity: Not on file   Other Topics Concern  . Not on file   Social History Narrative   Married , wife dx w/ rectal cancer 2013-- going to [facility]) ; lost wife ~ 03-2014   Children 30 boy -14 girl y/o      Family History  Problem Relation Age of Onset  . Dementia Father   . Heart attack Neg Hx   . Diabetes Neg Hx   . Colon cancer Neg Hx   . Prostate cancer Neg Hx     apparently F and BPH       Medication List       This list is accurate as of: 02/07/15  1:49 PM.  Always use your most recent med list.               azaTHIOprine 50 MG tablet  Commonly known as:  IMURAN  Take 100 mg by mouth daily.     fluticasone 50 MCG/ACT nasal spray  Commonly known as:  FLONASE  Place 2 sprays into the nose daily.  folic acid 1 MG tablet  Commonly known as:  FOLVITE  Take 1 mg by mouth daily.     methotrexate 2.5 MG tablet  Commonly known as:  RHEUMATREX  Caution:Chemotherapy. Protect from light.4 tabs by mouth two days a week.     PRED FORTE OP  Apply to eye as needed.     zolpidem 10 MG tablet  Commonly known as:  AMBIEN  TAKE 1 TABLET BY MOUTH EVERY NIGHT AT BEDTIME AS NEEDED FOR SLEEP     ZYRTEC ALLERGY 10 MG tablet  Generic drug:  cetirizine  Take 10 mg by mouth daily.           Objective:   Physical Exam BP 118/68 mmHg  Pulse 66  Temp(Src) 97.8 F (36.6 C) (Oral)  Ht 5\' 8"  (1.727 m)  Wt 224 lb 7 oz (101.804 kg)  BMI 34.13 kg/m2  SpO2 96%  General:   Well developed, well nourished . NAD.  Neck:  Full range of motion. Supple. No  Thyromegaly  HEENT:  Normocephalic . Face symmetric, atraumatic Lungs:   CTA B Normal respiratory effort, no intercostal retractions, no accessory muscle use. Heart: RRR,  no murmur.  No pretibial edema bilaterally  Abdomen:  Not distended, soft, non-tender. No rebound or rigidity. No mass,organomegaly Skin: Exposed areas without rash. Not pale. Not jaundice Neurologic:  alert & oriented X3.  Speech normal, gait appropriate for age and unassisted Strength symmetric and appropriate for age.  Psych: Cognition and judgment appear intact.  Cooperative with normal attention span and concentration.  Behavior appropriate. No anxious or depressed appearing.       Assessment & Plan:    \

## 2015-02-07 NOTE — Assessment & Plan Note (Signed)
Follow-up by Dr. Charlestine Night, he gets regular labs over there. Symptoms relatively well controlled.

## 2015-02-07 NOTE — Patient Instructions (Signed)
Get your blood work before you leave     Come back to the office in 1 year   for a physical exam  Please schedule an appointment at the front desk    Come back fasting

## 2015-02-07 NOTE — Assessment & Plan Note (Signed)
Lost wife in 2015, dealing okay with his loss, his 2 children are okay emotionally as well. Currently well-controlled with Ambien.

## 2015-02-07 NOTE — Assessment & Plan Note (Signed)
adacel 01-2007 PNM shot 2011  prevnar-- today zostavaxa-- unable to get d/t meds he takes   Colonoscopy (Dr Henrene Pastor)  02/07/2007 , due for a colonoscopy, plans to proceed this year Prostate cancer screening,  no DRE done today, will check a PSA Diet and exercise discussed

## 2015-02-07 NOTE — Progress Notes (Signed)
Pre visit review using our clinic review tool, if applicable. No additional management support is needed unless otherwise documented below in the visit note. 

## 2015-02-07 NOTE — Assessment & Plan Note (Signed)
Currently on Tums, doing okay.

## 2015-03-11 ENCOUNTER — Other Ambulatory Visit: Payer: Self-pay | Admitting: Internal Medicine

## 2015-03-11 NOTE — Telephone Encounter (Signed)
Ok 30 and 5 RF 

## 2015-03-11 NOTE — Telephone Encounter (Signed)
Rx printed, awaiting MD signature.  

## 2015-03-11 NOTE — Telephone Encounter (Signed)
Rx faxed to [facility] pharmacy.  

## 2015-03-11 NOTE — Telephone Encounter (Signed)
Pt is requesting refill on Ambien.  Last OV: 02/07/2015 Last Fill: 10/11/2014 #30 4RF  Please advise.

## 2015-07-05 ENCOUNTER — Ambulatory Visit (INDEPENDENT_AMBULATORY_CARE_PROVIDER_SITE_OTHER): Payer: No Typology Code available for payment source | Admitting: Family Medicine

## 2015-07-05 ENCOUNTER — Encounter: Payer: Self-pay | Admitting: Family Medicine

## 2015-07-05 VITALS — BP 122/75 | HR 77 | Temp 97.7°F | Resp 18 | Ht 69.0 in | Wt 225.0 lb

## 2015-07-05 DIAGNOSIS — J209 Acute bronchitis, unspecified: Secondary | ICD-10-CM

## 2015-07-05 DIAGNOSIS — J309 Allergic rhinitis, unspecified: Secondary | ICD-10-CM

## 2015-07-05 MED ORDER — BENZONATATE 100 MG PO CAPS: 100.0000 mg | ORAL_CAPSULE | Freq: Three times a day (TID) | ORAL | Status: DC | PRN

## 2015-07-05 MED ORDER — DOXYCYCLINE HYCLATE 100 MG PO TABS: 100.0000 mg | ORAL_TABLET | Freq: Two times a day (BID) | ORAL | Status: DC

## 2015-07-05 NOTE — Progress Notes (Signed)
Subjective:    Patient ID: Steven Lara, male    DOB: [birth year], 59 y.o.   MRN: [id number]  Chief Complaint  Patient presents with  . URI    cough with chest congestion, "can't get it up", ongoing for 2 weeks, tried guafenesin, fever one week ago with body aches     HPI Patient is in today for evaluation of respiratory symptoms. He has been dealing with several weeks worth of worsening congestion. In the first few days he had some fevers and chills but those have resolved. Headaches he feels today as if he is improving for the first time in several days. He has mild headache and nasal congestion productive of clear phlegm. He has postnasal drip and a cough she has been severe enough to cause momentary shortness of breath and gagging with some posttussive vomiting. This has not been recurrent. No other GI complaints. No ear pain or sore throat. Denies CP/palp/SOB/HA/fevers/GI or GU c/o. Taking meds as prescribed  Past Medical History  Diagnosis Date  . Allergic rhinitis   . Insomnia   . Reiter's syndrome     dx remotely and again 12/2008 (L eye iritys R foot problems)  . Mole (skin)     sees derm  . Diverticulosis   . Colon polyps     02/07/2007 Next Due: 02/2012 Results: Adenomatous Polyp (Dr Henrene Pastor)   . GERD (gastroesophageal reflux disease) 07/14/2013  . Allergy     seasonal  . Bronchitis, acute 07/05/2015    Past Surgical History  Procedure Laterality Date  . No past surgeries      Family History  Problem Relation Age of Onset  . Dementia Father   . Heart attack Neg Hx   . Diabetes Neg Hx   . Colon cancer Neg Hx   . Prostate cancer Neg Hx     apparently F and BPH  . Arthritis Brother     Social History   Social History  . Marital Status: Married    Spouse Name: N/A  . Number of Children: 2  . Years of Education: N/A   Occupational History  . Therapist, music     Social History Main Topics  . Smoking status: Never Smoker   . Smokeless tobacco: Never  Used  . Alcohol Use: Yes     Comment: socially  . Drug Use: No  . Sexual Activity: Not on file   Other Topics Concern  . Not on file   Social History Narrative   Married , wife dx w/ rectal cancer 2013-- going to [facility]) ; lost wife ~ 03-2014   Children 49 boy -77 girl y/o     Outpatient Prescriptions Prior to Visit  Medication Sig Dispense Refill  . azaTHIOprine (IMURAN) 50 MG tablet Take 100 mg by mouth daily.    . folic acid (FOLVITE) 1 MG tablet Take 1 mg by mouth daily.      . methotrexate (RHEUMATREX) 2.5 MG tablet Caution:Chemotherapy. Protect from light.4 tabs by mouth two days a week.    . PrednisoLONE Acetate (PRED FORTE OP) Apply to eye as needed.    . zolpidem (AMBIEN) 10 MG tablet Take 1 tablet (10 mg total) by mouth at bedtime as needed for sleep. for sleep 30 tablet 5  . cetirizine (ZYRTEC ALLERGY) 10 MG tablet Take 10 mg by mouth daily.    . fluticasone (FLONASE) 50 MCG/ACT nasal spray Place 2 sprays into the nose daily. (Patient not taking:  Reported on 02/07/2015) 16 g 2   No facility-administered medications prior to visit.    No Known Allergies  Review of Systems  Constitutional: Positive for fever and malaise/fatigue.  HENT: Positive for congestion and sore throat.   Eyes: Negative for discharge.  Respiratory: Positive for cough and sputum production. Negative for shortness of breath.   Cardiovascular: Negative for chest pain, palpitations and leg swelling.  Gastrointestinal: Positive for nausea and vomiting. Negative for abdominal pain and diarrhea.  Genitourinary: Negative for dysuria.  Musculoskeletal: Negative for falls.  Skin: Negative for rash.  Neurological: Positive for headaches. Negative for loss of consciousness.  Endo/Heme/Allergies: Negative for environmental allergies.  Psychiatric/Behavioral: Negative for depression. The patient is not nervous/anxious.        Objective:    Physical Exam  Constitutional: He is oriented to  person, place, and time. He appears well-developed and well-nourished. No distress.  HENT:  Head: Normocephalic and atraumatic.  Nose: Nose normal.  Nasal mucosa boggy and erythematous, throat erythematous.no swelling or pustules  Eyes: Right eye exhibits no discharge. Left eye exhibits no discharge.  Neck: Normal range of motion. Neck supple.  Cardiovascular: Normal rate and regular rhythm.   No murmur heard. Pulmonary/Chest: Effort normal and breath sounds normal.  Abdominal: Soft. Bowel sounds are normal. There is no tenderness.  Musculoskeletal: He exhibits no edema.  Neurological: He is alert and oriented to person, place, and time.  Skin: Skin is warm and dry.  Psychiatric: He has a normal mood and affect.  Nursing note and vitals reviewed.   BP 122/75 mmHg  Pulse 77  Temp(Src) 97.7 F (36.5 C) (Oral)  Resp 18  Ht 5\' 9"  (1.753 m)  Wt 225 lb (102.059 kg)  BMI 33.21 kg/m2  SpO2 96% Wt Readings from Last 3 Encounters:  07/05/15 225 lb (102.059 kg)  02/07/15 224 lb 7 oz (101.804 kg)  08/08/14 218 lb 4 oz (98.998 kg)     Lab Results  Component Value Date   GLUCOSE 90 02/27/2010   CHOL 179 02/07/2015   TRIG 104.0 02/07/2015   HDL 45.30 02/07/2015   LDLDIRECT 139.8 02/27/2010   LDLCALC 113* 02/07/2015   NA 139 02/27/2010   K 4.2 02/27/2010   CL 104 02/27/2010   CREATININE 0.8 02/27/2010   BUN 19 02/27/2010   CO2 27 02/27/2010   TSH 3.34 02/07/2015   PSA 1.12 02/07/2015    Lab Results  Component Value Date   TSH 3.34 02/07/2015   No results found for: WBC, HGB, HCT, MCV, PLT Lab Results  Component Value Date   NA 139 02/27/2010   K 4.2 02/27/2010   CO2 27 02/27/2010   GLUCOSE 90 02/27/2010   BUN 19 02/27/2010   CREATININE 0.8 02/27/2010   CALCIUM 8.7 02/27/2010   Lab Results  Component Value Date   CHOL 179 02/07/2015   Lab Results  Component Value Date   HDL 45.30 02/07/2015   Lab Results  Component Value Date   LDLCALC 113* 02/07/2015    Lab Results  Component Value Date   TRIG 104.0 02/07/2015   Lab Results  Component Value Date   CHOLHDL 4 02/07/2015   No results found for: HGBA1C     Assessment & Plan:   Problem List Items Addressed This Visit    Bronchitis, acute - Primary    Started on Tessalon perles, Encouraged increased rest and hydration, add probiotics, zinc such as Coldeze or Xicam. Treat fevers as needed. Elderberry liquid is very  soothing to throat. Given Doxycycline to use if symptoms worsen again.       Relevant Medications   benzonatate (TESSALON) 100 MG capsule   doxycycline (VIBRA-TABS) 100 MG tablet   Allergic rhinitis    Continue daily Flonase and Zyrtec and nasal saline         I am having Steven Lara start on benzonatate and doxycycline. I am also having him maintain his folic acid, methotrexate, cetirizine, azaTHIOprine, fluticasone, PrednisoLONE Acetate (PRED FORTE OP), and zolpidem.  Meds ordered this encounter  Medications  . benzonatate (TESSALON) 100 MG capsule    Sig: Take 1 capsule (100 mg total) by mouth 3 (three) times daily as needed for cough.    Dispense:  40 capsule    Refill:  0  . doxycycline (VIBRA-TABS) 100 MG tablet    Sig: Take 1 tablet (100 mg total) by mouth 2 (two) times daily.    Dispense:  20 tablet    Refill:  0     Penni Homans, MD

## 2015-07-05 NOTE — Patient Instructions (Signed)
Encouraged increased rest and hydration (64 oz), add probiotics such as [employer] or [facility], zinc such as Coldeze or Alveria Apley. Elderberry liquid for cough, vitamin C 500 mg to 9381 mg, aged Garlic caps another, plain Mucinex twice daily x 10 days  Acute Bronchitis Bronchitis is inflammation of the airways that extend from the windpipe into the lungs (bronchi). The inflammation often causes mucus to develop. This leads to a cough, which is the most common symptom of bronchitis.  In acute bronchitis, the condition usually develops suddenly and goes away over time, usually in a couple weeks. Smoking, allergies, and asthma can make bronchitis worse. Repeated episodes of bronchitis may cause further lung problems.  CAUSES Acute bronchitis is most often caused by the same virus that causes a cold. The virus can spread from person to person (contagious) through coughing, sneezing, and touching contaminated objects. SIGNS AND SYMPTOMS   Cough.   Fever.   Coughing up mucus.   Body aches.   Chest congestion.   Chills.   Shortness of breath.   Sore throat.  DIAGNOSIS  Acute bronchitis is usually diagnosed through a physical exam. Your health care provider will also ask you questions about your medical history. Tests, such as chest X-rays, are sometimes done to rule out other conditions.  TREATMENT  Acute bronchitis usually goes away in a couple weeks. Oftentimes, no medical treatment is necessary. Medicines are sometimes given for relief of fever or cough. Antibiotic medicines are usually not needed but may be prescribed in certain situations. In some cases, an inhaler may be recommended to help reduce shortness of breath and control the cough. A cool mist vaporizer may also be used to help thin bronchial secretions and make it easier to clear the chest.  HOME CARE INSTRUCTIONS  Get plenty of rest.   Drink enough fluids to keep your urine clear or pale  yellow (unless you have a medical condition that requires fluid restriction). Increasing fluids may help thin your respiratory secretions (sputum) and reduce chest congestion, and it will prevent dehydration.   Take medicines only as directed by your health care provider.  If you were prescribed an antibiotic medicine, finish it all even if you start to feel better.  Avoid smoking and secondhand smoke. Exposure to cigarette smoke or irritating chemicals will make bronchitis worse. If you are a smoker, consider using nicotine gum or skin patches to help control withdrawal symptoms. Quitting smoking will help your lungs heal faster.   Reduce the chances of another bout of acute bronchitis by washing your hands frequently, avoiding people with cold symptoms, and trying not to touch your hands to your mouth, nose, or eyes.   Keep all follow-up visits as directed by your health care provider.  SEEK MEDICAL CARE IF: Your symptoms do not improve after 1 week of treatment.  SEEK IMMEDIATE MEDICAL CARE IF:  You develop an increased fever or chills.   You have chest pain.   You have severe shortness of breath.  You have bloody sputum.   You develop dehydration.  You faint or repeatedly feel like you are going to pass out.  You develop repeated vomiting.  You develop a severe headache. MAKE SURE YOU:   Understand these instructions.  Will watch your condition.  Will get help right away if you are not doing well or get worse. Document Released: 10/29/2004 Document Revised: 02/05/2014 Document Reviewed: 03/14/2013 [facility] Patient Information 2015 [place], [place]. This information is not intended to  replace advice given to you by your health care provider. Make sure you discuss any questions you have with your health care provider.  

## 2015-07-05 NOTE — Progress Notes (Signed)
Pre visit review using our clinic review tool, if applicable. No additional management support is needed unless otherwise documented below in the visit note. 

## 2015-07-05 NOTE — Assessment & Plan Note (Signed)
Continue daily Flonase and Zyrtec and nasal saline

## 2015-07-05 NOTE — Assessment & Plan Note (Signed)
Started on Tessalon perles, Encouraged increased rest and hydration, add probiotics, zinc such as Coldeze or Xicam. Treat fevers as needed. Elderberry liquid is very soothing to throat. Given Doxycycline to use if symptoms worsen again.

## 2015-09-03 ENCOUNTER — Telehealth: Payer: Self-pay | Admitting: Internal Medicine

## 2015-09-03 MED ORDER — ZOLPIDEM TARTRATE 10 MG PO TABS: 10.0000 mg | ORAL_TABLET | Freq: Every evening | ORAL | Status: DC | PRN

## 2015-09-03 NOTE — Telephone Encounter (Signed)
Last Rx:  03/11/15, #30 x 5 refills Last OV:  02/07/15 and f/u 1 year advised.  Please advise zolpidem refill?

## 2015-09-03 NOTE — Telephone Encounter (Signed)
Relation to [id number] Call back [facility]: [facility] [id number] - [place], [place] AT [facility] OF [address] [phone] (Phone) [phone] (Fax)         Reason for call:  Patient requesting a refill zolpidem (AMBIEN) 10 MG tablet

## 2015-09-03 NOTE — Telephone Encounter (Signed)
Okay #30 and 5 refills 

## 2015-09-03 NOTE — Telephone Encounter (Signed)
Rx faxed to pharmacy  

## 2015-09-03 NOTE — Telephone Encounter (Signed)
Rx printed and forwarded to PCP for signature. 

## 2015-11-27 ENCOUNTER — Telehealth: Payer: Self-pay | Admitting: Internal Medicine

## 2015-11-27 NOTE — Telephone Encounter (Signed)
LM for pt to call and schedule flu shot or update records. & and due for CPE after 02/07/16

## 2015-12-06 LAB — CBC AND DIFFERENTIAL
HEMATOCRIT: 43 % (ref 41–53)
HEMOGLOBIN: 15.1 g/dL (ref 13.5–17.5)
WBC: 7.4 10*3/mL

## 2015-12-06 LAB — BASIC METABOLIC PANEL
BUN: 15 mg/dL (ref 4–21)
Creatinine: 0.8 mg/dL (ref 0.6–1.3)
Glucose: 93 mg/dL
Potassium: 5 mmol/L (ref 3.4–5.3)
Sodium: 143 mmol/L (ref 137–147)

## 2015-12-06 LAB — HEPATIC FUNCTION PANEL
ALT: 17 U/L (ref 10–40)
AST: 19 U/L (ref 14–40)
Alkaline Phosphatase: 90 U/L (ref 25–125)
Bilirubin, Total: 0.8 mg/dL

## 2016-02-04 DIAGNOSIS — H5213 Myopia, bilateral: Secondary | ICD-10-CM | POA: Diagnosis not present

## 2016-02-04 DIAGNOSIS — M0239 Reiter's disease, multiple sites: Secondary | ICD-10-CM | POA: Diagnosis not present

## 2016-02-06 DIAGNOSIS — L918 Other hypertrophic disorders of the skin: Secondary | ICD-10-CM | POA: Diagnosis not present

## 2016-02-06 DIAGNOSIS — L814 Other melanin hyperpigmentation: Secondary | ICD-10-CM | POA: Diagnosis not present

## 2016-02-06 DIAGNOSIS — D225 Melanocytic nevi of trunk: Secondary | ICD-10-CM | POA: Diagnosis not present

## 2016-03-05 ENCOUNTER — Other Ambulatory Visit: Payer: Self-pay | Admitting: Internal Medicine

## 2016-03-05 NOTE — Telephone Encounter (Signed)
Ok 30 and 2 Rf, please schedule an appointment on the patient's convenience for a CPX

## 2016-03-05 NOTE — Telephone Encounter (Signed)
Rx faxed to [facility] pharmacy.  

## 2016-03-05 NOTE — Telephone Encounter (Signed)
Pt is requesting refill on Ambien.  Last OV: 02/07/2015 Last Fill: 09/03/2015 #30 and 5RF UDS: Not needed  Please advise.

## 2016-03-05 NOTE — Telephone Encounter (Signed)
Rx printed, awaiting MD signature.  

## 2016-03-05 NOTE — Telephone Encounter (Signed)
Letter printed and mailed to Pt informing him to call and schedule CPE.

## 2016-03-16 DIAGNOSIS — M0230 Reiter's disease, unspecified site: Secondary | ICD-10-CM | POA: Diagnosis not present

## 2016-03-16 DIAGNOSIS — H209 Unspecified iridocyclitis: Secondary | ICD-10-CM | POA: Diagnosis not present

## 2016-03-16 DIAGNOSIS — Z79899 Other long term (current) drug therapy: Secondary | ICD-10-CM | POA: Diagnosis not present

## 2016-03-16 LAB — BASIC METABOLIC PANEL
BUN: 13 mg/dL (ref 4–21)
Creatinine: 0.7 mg/dL (ref 0.6–1.3)
GLUCOSE: 118 mg/dL
Potassium: 4.5 mmol/L (ref 3.4–5.3)
Sodium: 141 mmol/L (ref 137–147)

## 2016-03-16 LAB — HEPATIC FUNCTION PANEL
ALT: 18 U/L (ref 10–40)
AST: 20 U/L (ref 14–40)
Alkaline Phosphatase: 79 U/L (ref 25–125)
BILIRUBIN, TOTAL: 0.6 mg/dL

## 2016-03-16 LAB — CBC AND DIFFERENTIAL
HEMATOCRIT: 42 % (ref 41–53)
HEMOGLOBIN: 14.3 g/dL (ref 13.5–17.5)
PLATELETS: 280 10*3/uL (ref 150–399)
WBC: 6.5 10^3/mL

## 2016-04-01 ENCOUNTER — Encounter: Payer: Self-pay | Admitting: Internal Medicine

## 2016-04-01 ENCOUNTER — Telehealth: Payer: Self-pay

## 2016-04-01 ENCOUNTER — Ambulatory Visit (INDEPENDENT_AMBULATORY_CARE_PROVIDER_SITE_OTHER): Payer: BLUE CROSS/BLUE SHIELD | Admitting: Internal Medicine

## 2016-04-01 VITALS — BP 122/64 | HR 52 | Temp 98.0°F | Ht 69.0 in | Wt 227.2 lb

## 2016-04-01 DIAGNOSIS — Z0000 Encounter for general adult medical examination without abnormal findings: Secondary | ICD-10-CM

## 2016-04-01 DIAGNOSIS — Z1211 Encounter for screening for malignant neoplasm of colon: Secondary | ICD-10-CM

## 2016-04-01 LAB — LIPID PANEL
Cholesterol: 176 mg/dL (ref 0–200)
HDL: 45.4 mg/dL (ref >39.00)
LDL CALC: 116 mg/dL — AB (ref 0–99)
NONHDL: 130.74
Total CHOL/HDL Ratio: 4
Triglycerides: 72 mg/dL (ref 0.0–149.0)
VLDL: 14.4 mg/dL (ref 0.0–40.0)

## 2016-04-01 LAB — TSH: TSH: 3.32 u[IU]/mL (ref 0.35–4.50)

## 2016-04-01 NOTE — Progress Notes (Signed)
Subjective:    Patient ID: Steven Lara, male    DOB: 10-Mar-1956, 60 y.o.   MRN: [id number]  DOS:  04/01/2016 Type of visit - description : CPX Interval history: No major concerns, doing well, good compliance of medication.   Review of Systems Constitutional: No fever. No chills. No unexplained wt changes. No unusual sweats  HEENT: No dental problems, no ear discharge, no facial swelling, no voice changes. No eye discharge, no eye  redness , no  intolerance to light   Respiratory: No wheezing , no  difficulty breathing. No cough , no mucus production  Cardiovascular: No CP, no leg swelling , no  Palpitations  GI: no nausea, no vomiting, no diarrhea , no  abdominal pain.  No blood in the stools. No dysphagia, no odynophagia    Endocrine: No polyphagia, no polyuria , no polydipsia  GU: No dysuria, gross hematuria, difficulty urinating. No urinary urgency, no frequency.  Musculoskeletal: No joint swellings or unusual aches or pains  Skin: No change in the color of the skin, palor , no  Rash  Allergic, immunologic: No environmental allergies , no  food allergies  Neurological: No dizziness no  syncope. No headaches. No diplopia, no slurred, no slurred speech, no motor deficits, no facial  Numbness  Hematological: No enlarged lymph nodes, no easy bruising , no unusual bleedings  Psychiatry: No suicidal ideas, no hallucinations, no beavior problems, no confusion.  No unusual/severe anxiety, no depression   Past Medical History  Diagnosis Date  . Allergic rhinitis   . Insomnia   . Reiter's syndrome ([place])     dx remotely and again 12/2008 (L eye iritys R foot problems)  . Mole (skin)     sees derm  . Diverticulosis   . Colon polyps     02/07/2007 Next Due: 02/2012 Results: Adenomatous Polyp (Dr Henrene Pastor)   . GERD (gastroesophageal reflux disease) 07/14/2013  . Allergy     seasonal  . Bronchitis, acute 07/05/2015    Past Surgical History  Procedure Laterality Date  .  No past surgeries      Social History   Social History  . Marital Status: Married    Spouse Name: N/A  . Number of Children: 2  . Years of Education: N/A   Occupational History  . office manager     Social History Main Topics  . Smoking status: Never Smoker   . Smokeless tobacco: Never Used  . Alcohol Use: Yes     Comment: socially  . Drug Use: No  . Sexual Activity: Not on file   Other Topics Concern  . Not on file   Social History Narrative   Married , wife dx w/ rectal cancer 2013-- going to [facility]) ; lost wife ~ 03-2014   Children 47 boy -22  girl y/o      Family History  Problem Relation Age of Onset  . Dementia Father   . Heart attack Neg Hx   . Diabetes Neg Hx   . Colon cancer Neg Hx   . Prostate cancer Neg Hx     apparently F and BPH  . Arthritis Brother        Medication List       This list is accurate as of: 04/01/16  5:41 PM.  Always use your most recent med list.               azaTHIOprine 50 MG tablet  Commonly known as:  [employer]  Take 100 mg by mouth daily.     fluticasone 50 MCG/ACT nasal spray  Commonly known as:  FLONASE  Place 2 sprays into the nose daily.     folic acid 1 MG tablet  Commonly known as:  FOLVITE  Take 1 mg by mouth daily.     methotrexate 2.5 MG tablet  Commonly known as:  RHEUMATREX  Caution:Chemotherapy. Protect from light.4 tabs by mouth two days a week.     PRED FORTE OP  Apply to eye as needed.     zolpidem 10 MG tablet  Commonly known as:  AMBIEN  Take 1 tablet (10 mg total) by mouth at bedtime as needed for sleep.     ZYRTEC ALLERGY 10 MG tablet  Generic drug:  cetirizine  Take 10 mg by mouth daily.           Objective:   Physical Exam BP 122/64 mmHg  Pulse 52  Temp(Src) 98 F (36.7 C) (Oral)  Ht 5\' 9"  (1.753 m)  Wt 227 lb 4 oz (103.08 kg)  BMI 33.54 kg/m2  SpO2 95%  General:   Well developed, well nourished . NAD.  Neck: No  thyromegaly  HEENT:  Normocephalic . Face  symmetric, atraumatic Lungs:  CTA B Normal respiratory effort, no intercostal retractions, no accessory muscle use. Heart: RRR,  no murmur.  No pretibial edema bilaterally  Abdomen:  Not distended, soft, non-tender. No rebound or rigidity.   Skin: Exposed areas without rash. Not pale. Not jaundice Neurologic:  alert & oriented X3.  Speech normal, gait appropriate for age and unassisted Strength symmetric and appropriate for age.  Psych: Cognition and judgment appear intact.  Cooperative with normal attention span and concentration.  Behavior appropriate. No anxious or depressed appearing.    Assessment & Plan:   Assessment Insomnia Reiter's syndrome: dx remotely and again 2010 ( left eye iritis,   foot pain), Dr Charlestine Night  GERD Seasonal allergies  Moles: Sees dermatology  PLAN Chronic medical problems well-controlled. Refill Ambien as needed. RTC one year

## 2016-04-01 NOTE — Progress Notes (Signed)
Pre visit review using our clinic review tool, if applicable. No additional management support is needed unless otherwise documented below in the visit note. 

## 2016-04-01 NOTE — Patient Instructions (Signed)
GO TO THE LAB : Get the blood work     GO TO THE FRONT DESK Schedule your next appointment for a second exam in one year, fasting.

## 2016-04-01 NOTE — Assessment & Plan Note (Addendum)
adacel 01-2007; PNM shot 2011 ; prevnar-- 2016 zostavax-- hold for now   Colonoscopy (Dr Henrene Pastor)  02/07/2007, due for a colonoscopy, GI referral  Prostate cancer screening, last PSA normal, DRE and PSA next year He gets routine labs at rheumatology, will get records, check a FLP and TSH. Diet and exercise discussed

## 2016-04-01 NOTE — Telephone Encounter (Signed)
Requesting records since 2013 and most recent lab results from Dr. Elmon Else office. Last OV notes received in 01/2012. Request faxed to [phone]. Received fax confirmation on 04/01/2016 at 1014.

## 2016-04-08 NOTE — Telephone Encounter (Signed)
Received medical records from Dr. Elmon Else office. Labs abstracted. Records placed in PCP red folder for review.

## 2016-04-08 NOTE — Telephone Encounter (Signed)
Second request for records faxed to [phone].

## 2016-04-08 NOTE — Telephone Encounter (Signed)
Received fax confirmation on 04/08/2016 at 0921.

## 2016-04-16 NOTE — Telephone Encounter (Signed)
Labs: From 12/06/2015. Blood sugar 93, creatinine 0.7, potassium normal, LFTs normal, hemoglobin 15  From 03/16/2016 Blood sugar 118, creatinine 0.6, potassium normal. Hemoglobin 14.3,

## 2016-04-23 DIAGNOSIS — M0230 Reiter's disease, unspecified site: Secondary | ICD-10-CM | POA: Diagnosis not present

## 2016-04-23 DIAGNOSIS — Z79899 Other long term (current) drug therapy: Secondary | ICD-10-CM | POA: Diagnosis not present

## 2016-04-23 DIAGNOSIS — H209 Unspecified iridocyclitis: Secondary | ICD-10-CM | POA: Diagnosis not present

## 2016-06-10 ENCOUNTER — Telehealth: Payer: Self-pay | Admitting: Internal Medicine

## 2016-06-10 NOTE — Telephone Encounter (Signed)
°  Relationship to patient: Self  Can be reached: [phone]   Pharmacy:  [facility] [place], [address] AT [place] [phone] (Phone) [phone] (Fax)    Reason for call: Request refill on zolpidem (AMBIEN) 10 MG tablet [id number]

## 2016-06-11 MED ORDER — ZOLPIDEM TARTRATE 10 MG PO TABS: 10.0000 mg | ORAL_TABLET | Freq: Every evening | ORAL | 0 refills | Status: DC | PRN

## 2016-06-11 NOTE — Telephone Encounter (Signed)
Rx printed, awaiting DO signature.  

## 2016-06-11 NOTE — Telephone Encounter (Signed)
Refill x1 

## 2016-06-11 NOTE — Telephone Encounter (Signed)
Pt is requesting refill on Ambien. Dr. Ethel Rana Pt.  Last OV: 04/01/2016 Last Fill: 03/05/2016 #30 and 2RF   Please advise.

## 2016-06-11 NOTE — Telephone Encounter (Signed)
Rx faxed to [facility] pharmacy.  

## 2016-07-03 DIAGNOSIS — Z79899 Other long term (current) drug therapy: Secondary | ICD-10-CM | POA: Diagnosis not present

## 2016-07-03 DIAGNOSIS — H209 Unspecified iridocyclitis: Secondary | ICD-10-CM | POA: Diagnosis not present

## 2016-07-03 DIAGNOSIS — M0230 Reiter's disease, unspecified site: Secondary | ICD-10-CM | POA: Diagnosis not present

## 2016-07-07 ENCOUNTER — Other Ambulatory Visit: Payer: Self-pay | Admitting: Family Medicine

## 2016-07-07 NOTE — Telephone Encounter (Signed)
Pt is requesting refill on Ambien.  Last OV: 04/01/2016 Last Fill: 06/11/2016 #30 and 0RF UDS: Not needed for Ambien  Please advise.

## 2016-07-08 NOTE — Telephone Encounter (Signed)
Rx faxed to [facility] pharmacy.  

## 2016-07-08 NOTE — Telephone Encounter (Signed)
Ok x 5 months  

## 2016-07-08 NOTE — Telephone Encounter (Signed)
Rx printed, awaiting MD signature.  

## 2016-07-29 ENCOUNTER — Encounter: Payer: Self-pay | Admitting: Medical

## 2016-07-29 ENCOUNTER — Ambulatory Visit (INDEPENDENT_AMBULATORY_CARE_PROVIDER_SITE_OTHER): Payer: BLUE CROSS/BLUE SHIELD | Admitting: Medical

## 2016-07-29 VITALS — BP 110/68 | HR 78 | Temp 98.0°F | Ht 69.0 in | Wt 232.4 lb

## 2016-07-29 DIAGNOSIS — M791 Myalgia, unspecified site: Secondary | ICD-10-CM

## 2016-07-29 DIAGNOSIS — H8110 Benign paroxysmal vertigo, unspecified ear: Secondary | ICD-10-CM | POA: Diagnosis not present

## 2016-07-29 DIAGNOSIS — R5383 Other fatigue: Secondary | ICD-10-CM | POA: Diagnosis not present

## 2016-07-29 DIAGNOSIS — J3489 Other specified disorders of nose and nasal sinuses: Secondary | ICD-10-CM

## 2016-07-29 DIAGNOSIS — J301 Allergic rhinitis due to pollen: Secondary | ICD-10-CM

## 2016-07-29 DIAGNOSIS — H6692 Otitis media, unspecified, left ear: Secondary | ICD-10-CM

## 2016-07-29 LAB — POC INFLUENZA A&B (BINAX/QUICKVUE)
INFLUENZA A, POC: NEGATIVE
Influenza B, POC: NEGATIVE

## 2016-07-29 MED ORDER — FLUTICASONE PROPIONATE 50 MCG/ACT NA SUSP: 2.0000 | Freq: Every day | NASAL | 2 refills | Status: DC

## 2016-07-29 MED ORDER — AMOXICILLIN-POT CLAVULANATE 875-125 MG PO TABS: 1.0000 | ORAL_TABLET | Freq: Two times a day (BID) | ORAL | 0 refills | Status: DC

## 2016-07-29 MED ORDER — MECLIZINE HCL 12.5 MG PO TABS: 12.5000 mg | ORAL_TABLET | Freq: Three times a day (TID) | ORAL | 0 refills | Status: DC | PRN

## 2016-07-29 NOTE — Patient Instructions (Addendum)
For history of recent allergies rx flonase.  For left ear early infection and possible sinus infection rx augmentin.   For vertigo will rx meclizine. While on over next 7 days would hold your zyrtec. Regarding vertigo/dizziness if symptom constant severe  with associated signs or symptoms as discussed then ED evaluation. If vertigo not subsiding PT for head maneuver therapy also option.   For fatigue may be related to the above. Labs done in summer. But if over next 4-5 days energy not improving then get cbc, cmp and tsh.  For recent 2 days myalgia will get flu test. Test was negative. Advised when feeling better next week then get flu vaccine next week/nurse visit.   Follow up in 7 days or as needed

## 2016-07-29 NOTE — Progress Notes (Signed)
Pre visit review using our clinic review tool, if applicable. No additional management support is needed unless otherwise documented below in the visit note. 

## 2016-07-29 NOTE — Progress Notes (Signed)
Subjective:    Patient ID: Steven Lara, male    DOB: 12/17/1955, 60 y.o.   MRN: [id number]  HPI  Pt in states he has been feeling fatigued with some intermittent dizziness last 4-5 days.   Mild fatigue and intermittent  Dizziness. Dizziness on changing positions and rolling over in bed(last 10-15 seconds then subsides quickly. Symptom transient and no gross motor or sensory function deficits. Maybe years ago one event of vertigo in past.   He does report some mild nasal congestion for about a month.  Pt is on zyrtec only. No flonase. No fever, no chills or sweats.No sinus pressure currently had some last week. Also some mild transient pain left ear about one week ago. Each spring and fall some mild allergie.   Review of Systems  Constitutional: Positive for fatigue. Negative for chills, diaphoresis and fever.  HENT: Positive for congestion and postnasal drip. Negative for sore throat, tinnitus, trouble swallowing and voice change.        Yesterday some colored mucous when he blew his nose. Clears throat will get some mucous as well.  Respiratory: Negative for cough, chest tightness, shortness of breath and wheezing.   Cardiovascular: Negative for chest pain and palpitations.  Gastrointestinal: Negative for abdominal distention, abdominal pain, blood in stool, constipation, diarrhea, nausea and rectal pain.  Endocrine: Negative for polydipsia, polyphagia and polyuria.  Genitourinary: Negative for difficulty urinating, dysuria, flank pain, frequency, penile pain, scrotal swelling, testicular pain and urgency.  Musculoskeletal: Negative for back pain, myalgias, neck pain and neck stiffness.       Faint leg aches just recently. Last 2 nights.  Neurological: Positive for dizziness. Negative for syncope, speech difficulty, weakness, light-headedness, numbness and headaches.       Trransient on position changes. Standing to sitting. Some very rapid transient room spinnin on turning over in  bed.  Hematological: Negative for adenopathy. Does not bruise/bleed easily.  Psychiatric/Behavioral: Negative for agitation.   Past Medical History:  Diagnosis Date  . Allergic rhinitis   . Allergy    seasonal  . Bronchitis, acute 07/05/2015  . Colon polyps    02/07/2007 Next Due: 02/2012 Results: Adenomatous Polyp (Dr Henrene Pastor)   . Diverticulosis   . GERD (gastroesophageal reflux disease) 07/14/2013  . Insomnia   . Mole (skin)    sees derm  . Reiter's syndrome ([place])    dx remotely and again 12/2008 (L eye iritys R foot problems)     Social History   Social History  . Marital status: Married    Spouse name: N/A  . Number of children: 2  . Years of education: N/A   Occupational History  . office manager     Social History Main Topics  . Smoking status: Never Smoker  . Smokeless tobacco: Never Used  . Alcohol use Yes     Comment: socially  . Drug use: No  . Sexual activity: Not on file   Other Topics Concern  . Not on file   Social History Narrative   Married , wife dx w/ rectal cancer 2013-- going to [facility]) ; lost wife ~ 03-2014   Children 69 boy -49  girl y/o     Past Surgical History:  Procedure Laterality Date  . NO PAST SURGERIES      Family History  Problem Relation Age of Onset  . Dementia Father   . Heart attack Neg Hx   . Diabetes Neg Hx   . Colon cancer Neg  Hx   . Prostate cancer Neg Hx     apparently F and BPH  . Arthritis Brother     No Known Allergies  Current Outpatient Prescriptions on File Prior to Visit  Medication Sig Dispense Refill  . azaTHIOprine (IMURAN) 50 MG tablet Take 100 mg by mouth daily.    . cetirizine (ZYRTEC ALLERGY) 10 MG tablet Take 10 mg by mouth daily.    . folic acid (FOLVITE) 1 MG tablet Take 1 mg by mouth daily.      . methotrexate (RHEUMATREX) 2.5 MG tablet Caution:Chemotherapy. Protect from light.4 tabs by mouth two days a week.    . PrednisoLONE Acetate (PRED FORTE OP) Apply to eye as needed.      . zolpidem (AMBIEN) 10 MG tablet Take 1 tablet (10 mg total) by mouth at bedtime as needed for sleep. 30 tablet 5  . fluticasone (FLONASE) 50 MCG/ACT nasal spray Place 2 sprays into the nose daily. 16 g 2  . [DISCONTINUED] fexofenadine (ALLEGRA) 180 MG tablet Take 180 mg by mouth daily.       No current facility-administered medications on file prior to visit.     BP 110/68   Pulse 78   Temp 98 F (36.7 C) (Oral)   Ht 5\' 9"  (1.753 m)   Wt 232 lb 6.4 oz (105.4 kg)   SpO2 98%   BMI 34.32 kg/m       Objective:   Physical Exam  General Mental Status- Alert. General Appearance- Not in acute distress.     Skin Rashes- No Rashes.  HEENT Head- Normal. Ear Auditory Canal - Left- Normal. Right - Normal.Tympanic Membrane- Left- Normal. Right- Normal. Eye Sclera/Conjunctiva- Left- moderate red upper 1/3 area Right- Normal. Nose & Sinuses Nasal Mucosa- Left-  Boggy and Congested. Right-  Boggy and  Congested.Bilateral maxillary and frontal sinus pressure. Mouth & Throat Lips: Upper Lip- Normal: no dryness, cracking, pallor, cyanosis, or vesicular eruption. Lower Lip-Normal: no dryness, cracking, pallor, cyanosis or vesicular eruption. Buccal Mucosa- Bilateral- No Aphthous ulcers. Oropharynx- No Discharge or Erythema. +pnd Tonsils: Characteristics- Bilateral- No Erythema or Congestion. Size/Enlargement- Bilateral- No enlargement. Discharge- bilateral-None.   Neck Carotid Arteries- Normal color. Moisture- Normal Moisture. No carotid bruits. No JVD.  Chest and Lung Exam Auscultation: Breath Sounds:-Normal. cta.  Cardiovascular Auscultation:Rythm- Regular. Murmurs & Other Heart Sounds:Auscultation of the heart reveals- No Murmurs.  Abdomen Inspection:-Inspeection Normal. Palpation/Percussion:Note:No mass. Palpation and Percussion of the abdomen reveal- Non Tender, Non Distended + BS, no rebound or guarding.    Neurologic Cranial Nerve exam:- CN III-XII intact(No  nystagmus), symmetric smile. Drift Test:- No drift. Romberg Exam:- Negative.  Heal to Toe Gait exam:-Normal. Finger to Nose:- Normal/Intact Strength:- 5/5 equal and symmetric strength both upper and lower extremities.  Faint transient vertigo/dizziness on lying supine lasting 10-15 seconds. Then resolved.     Assessment & Plan:  For history of recent allergies rx flonase.  For left ear early infection and possible sinus infection rx augmentin.   For vertigo will rx meclizine. While on over next 7 days would hold your zyrtec. Regarding vertigo/dizziness if symptom constant severe  with associated signs or symptoms as discussed then ED evaluation. If vertigo not subsiding PT for head maneuver therapy also option.   For fatigue may be related to the above. Labs done in summer. But if over next 4-5 days energy not improving then get cbc, cmp and tsh.  For recent 2 days myalgia will get flu test.  Follow up in  7 days or as needed  Mackie Pai, [employer]

## 2016-09-08 DIAGNOSIS — Z23 Encounter for immunization: Secondary | ICD-10-CM | POA: Diagnosis not present

## 2016-09-21 ENCOUNTER — Encounter: Payer: Self-pay | Admitting: Medical

## 2016-09-21 ENCOUNTER — Ambulatory Visit (INDEPENDENT_AMBULATORY_CARE_PROVIDER_SITE_OTHER): Payer: BLUE CROSS/BLUE SHIELD | Admitting: Medical

## 2016-09-21 VITALS — BP 116/74 | HR 74 | Temp 97.9°F | Resp 16 | Ht 69.0 in | Wt 229.2 lb

## 2016-09-21 DIAGNOSIS — Z86010 Personal history of colonic polyps: Secondary | ICD-10-CM

## 2016-09-21 DIAGNOSIS — K649 Unspecified hemorrhoids: Secondary | ICD-10-CM | POA: Diagnosis not present

## 2016-09-21 MED ORDER — TRAMADOL HCL 50 MG PO TABS: 50.0000 mg | ORAL_TABLET | Freq: Three times a day (TID) | ORAL | 0 refills | Status: DC | PRN

## 2016-09-21 MED ORDER — HYDROCORTISONE ACETATE 25 MG RE SUPP: 25.0000 mg | Freq: Two times a day (BID) | RECTAL | 0 refills | Status: DC

## 2016-09-21 NOTE — Progress Notes (Signed)
Subjective:    Patient ID: Steven Lara, male    DOB: 14-Jun-1956, 60 y.o.   MRN: [id number]  HPI  Pt in with recent flare of hemorroids. He states started to have rectal  pain on Friday and some bleeding on Saturday. Mild itching. The pain was severe on Saturday but now is low level 2-4/10 level pain  This is first time patient has had any hemorrhoids. No constipation reported but does admit very poor diet.  Pt states he is late for his colonoscopy. He was told polyps in past. He was told to repeat but late.GI did call him and he did not schedule.    Review of Systems  Constitutional: Negative for chills, fatigue and fever.  Respiratory: Negative for cough, chest tightness, shortness of breath and wheezing.   Cardiovascular: Negative for chest pain and palpitations.  Gastrointestinal: Positive for rectal pain. Negative for abdominal pain, constipation, diarrhea, nausea and vomiting.  Musculoskeletal: Negative for back pain.  Neurological: Negative for dizziness and headaches.  Hematological: Negative for adenopathy. Does not bruise/bleed easily.   Past Medical History:  Diagnosis Date  . Allergic rhinitis   . Allergy    seasonal  . Bronchitis, acute 07/05/2015  . Colon polyps    02/07/2007 Next Due: 02/2012 Results: Adenomatous Polyp (Dr Henrene Pastor)   . Diverticulosis   . GERD (gastroesophageal reflux disease) 07/14/2013  . Insomnia   . Mole (skin)    sees derm  . Reiter's syndrome ([place])    dx remotely and again 12/2008 (L eye iritys R foot problems)     Social History   Social History  . Marital status: Married    Spouse name: N/A  . Number of children: 2  . Years of education: N/A   Occupational History  . office manager     Social History Main Topics  . Smoking status: Never Smoker  . Smokeless tobacco: Never Used  . Alcohol use Yes     Comment: socially  . Drug use: No  . Sexual activity: Not on file   Other Topics Concern  . Not on file   Social History  Narrative   Married , wife dx w/ rectal cancer 2013-- going to [facility]) ; lost wife ~ 03-2014   Children 60 boy -33  girl y/o     Past Surgical History:  Procedure Laterality Date  . NO PAST SURGERIES      Family History  Problem Relation Age of Onset  . Dementia Father   . Heart attack Neg Hx   . Diabetes Neg Hx   . Colon cancer Neg Hx   . Prostate cancer Neg Hx     apparently F and BPH  . Arthritis Brother     No Known Allergies  Current Outpatient Prescriptions on File Prior to Visit  Medication Sig Dispense Refill  . azaTHIOprine (IMURAN) 50 MG tablet Take 100 mg by mouth daily.    . cetirizine (ZYRTEC ALLERGY) 10 MG tablet Take 10 mg by mouth daily.    . fluticasone (FLONASE) 50 MCG/ACT nasal spray Place 2 sprays into both nostrils daily. 16 g 2  . folic acid (FOLVITE) 1 MG tablet Take 1 mg by mouth daily.      . methotrexate (RHEUMATREX) 2.5 MG tablet Caution:Chemotherapy. Protect from light.4 tabs by mouth two days a week.    . PrednisoLONE Acetate (PRED FORTE OP) Apply to eye as needed.    . zolpidem (AMBIEN) 10 MG tablet Take  1 tablet (10 mg total) by mouth at bedtime as needed for sleep. 30 tablet 5  . [DISCONTINUED] fexofenadine (ALLEGRA) 180 MG tablet Take 180 mg by mouth daily.       No current facility-administered medications on file prior to visit.     BP 116/74 (BP Location: Left Arm, Patient Position: Sitting, Cuff Size: Normal)   Pulse 74   Temp 97.9 F (36.6 C) (Oral)   Resp 16   Ht 5\' 9"  (1.753 m)   Wt 229 lb 3.2 oz (104 kg)   SpO2 97%   BMI 33.85 kg/m       Objective:   Physical Exam   General- No acute distress. Pleasant patient. Neck- Full range of motion, no jvd Lungs- Clear, even and unlabored. Heart- regular rate and rhythm. Neurologic- CNII- XII grossly intact. Abdomen- soft, nontender, nondistended,+bs,no rebound or guarding and no organomegaly. Rectal exam- 2 small hemorrhoid at [facility] and 10'oclock position. But  at [facility] position which is about 1.5 cm by by 1 cm. Appears possible early thrombosing. Center small pinpoint area that looks bruises. Faint bright red blood smeared on hemorrhoid.(not real tender to palpation presently)     Assessment & Plan:  For your hemorroids will rx anusol hc suppository. Also try sitz bath daily. Can use tylenol or  ibuprofen for pain. If pain worsens then tramadol rx available.  The area should flare down but if same or worse by Wednesday then will refer you to general surgeon. Please update me on wed am.  I am going to refer you to your GI doctor for repeat colonsocopy.  Follow up in 10-14 days or as needed  Iain Sawchuk, Percell Miller, [employer]

## 2016-09-21 NOTE — Patient Instructions (Addendum)
For your hemorroids will rx anusol hc suppository. Also try sitz bath daily. Can use tylenol or  ibuprofen for pain. If pain worsens then tramadol rx available.  The area should flare down but if same or worse by Wednesday  then will refer you to general surgeon. Please update me on wed am.  I am going to refer you to your GI doctor for repeat colonsocopy.  Follow up in 10-14 days or as needed

## 2016-09-22 ENCOUNTER — Encounter: Payer: Self-pay | Admitting: Physician Assistant

## 2016-09-23 ENCOUNTER — Telehealth: Payer: Self-pay | Admitting: Internal Medicine

## 2016-09-23 DIAGNOSIS — Z79899 Other long term (current) drug therapy: Secondary | ICD-10-CM | POA: Diagnosis not present

## 2016-09-23 DIAGNOSIS — M542 Cervicalgia: Secondary | ICD-10-CM | POA: Diagnosis not present

## 2016-09-23 DIAGNOSIS — K649 Unspecified hemorrhoids: Secondary | ICD-10-CM

## 2016-09-23 DIAGNOSIS — H209 Unspecified iridocyclitis: Secondary | ICD-10-CM | POA: Diagnosis not present

## 2016-09-23 DIAGNOSIS — K644 Residual hemorrhoidal skin tags: Secondary | ICD-10-CM | POA: Diagnosis not present

## 2016-09-23 DIAGNOSIS — M0230 Reiter's disease, unspecified site: Secondary | ICD-10-CM | POA: Diagnosis not present

## 2016-09-23 NOTE — Telephone Encounter (Signed)
°  Relation to [id number] Call back number:[phone] Pharmacy:  Reason for call:  Patient calling back checking on the status of message below, please advise

## 2016-09-23 NOTE — Telephone Encounter (Signed)
Patient was seen on Monday 09/21/16; please advise/SLS

## 2016-09-23 NOTE — Telephone Encounter (Signed)
°  Relation to [id number] [phone] Pharmacy:  Reason for call:  Pt states he saw you on Monday, and was informed to call you back this morning to discuss possible surgery due to his hemorrhoids pt would like for you to call him back this morning

## 2016-09-23 NOTE — Telephone Encounter (Signed)
I called pt today and explained will try to get him in with surgeon soon.

## 2016-10-01 DIAGNOSIS — Z79899 Other long term (current) drug therapy: Secondary | ICD-10-CM | POA: Diagnosis not present

## 2016-10-01 DIAGNOSIS — M0230 Reiter's disease, unspecified site: Secondary | ICD-10-CM | POA: Diagnosis not present

## 2016-10-01 DIAGNOSIS — H209 Unspecified iridocyclitis: Secondary | ICD-10-CM | POA: Diagnosis not present

## 2016-10-01 DIAGNOSIS — M542 Cervicalgia: Secondary | ICD-10-CM | POA: Diagnosis not present

## 2016-10-06 ENCOUNTER — Encounter: Payer: Self-pay | Admitting: Physician Assistant

## 2016-10-06 ENCOUNTER — Ambulatory Visit (INDEPENDENT_AMBULATORY_CARE_PROVIDER_SITE_OTHER): Payer: BLUE CROSS/BLUE SHIELD | Admitting: Physician Assistant

## 2016-10-06 VITALS — BP 120/70 | HR 78 | Ht 69.0 in | Wt 226.0 lb

## 2016-10-06 DIAGNOSIS — K644 Residual hemorrhoidal skin tags: Secondary | ICD-10-CM | POA: Diagnosis not present

## 2016-10-06 DIAGNOSIS — Z1211 Encounter for screening for malignant neoplasm of colon: Secondary | ICD-10-CM | POA: Diagnosis not present

## 2016-10-06 DIAGNOSIS — Z86010 Personal history of colonic polyps: Secondary | ICD-10-CM | POA: Diagnosis not present

## 2016-10-06 MED ORDER — NA SULFATE-K SULFATE-MG SULF 17.5-3.13-1.6 GM/177ML PO SOLN: 1.0000 | Freq: Once | ORAL | 0 refills | Status: AC

## 2016-10-06 NOTE — Patient Instructions (Addendum)
You have been scheduled for a colonoscopy. Please follow written instructions given to you at your visit today.  Please pick up your prep supplies at the pharmacy within the next 1-3 days. [place]  If you use inhalers (even only as needed), please bring them with you on the day of your procedure. Your physician has requested that you go to www.startemmi.com and enter the access code given to you at your visit today. This web site gives a general overview about your procedure. However, you should still follow specific instructions given to you by our office regarding your preparation for the procedure.

## 2016-10-06 NOTE — Progress Notes (Signed)
Agree with initial assessment and plans 

## 2016-10-06 NOTE — Progress Notes (Signed)
Subjective:    Patient ID: Steven Lara, male    DOB: 08-11-1956, 61 y.o.   MRN: [id number]  HPI Steven Lara is a pleasant 61 year old white male known to Steven Lara who comes in today, referred by Steven Lara to discuss follow-up colonoscopy. Patient had a recent visit with primary care with complaints of hemorrhoidal pain and bleeding. He was seen on 09/21/2016 and on rectal exam noted to have 3 external hemorrhoids 1 which appeared to be partially thrombosed. Patient says that he was sent to Steven Lara for evaluation and when seen there was told that he did not need to have anything further done as the hemorrhoids were "receding". He said he had had quite a bit of pain and some bleeding all of which has subsided. He says he still has some irritation intermittently and has been using a hemorrhoid cream as needed. Says his diet has not been the best and feels that that may have contributed. He is trying to eat healthier. He has no complaints of abdominal pain changes in bowel habits etc. Patient did have colonoscopy done in May 2008 per Steven Lara with finding of one 3 mm polyp which was removed and found to be a tubular adenoma and also had mild sigmoid diverticulosis. He is overdue for follow-up colonoscopy.  Review of Systems Pertinent positive and negative review of systems were noted in the above HPI section.  All other review of systems was otherwise negative.  Outpatient Encounter Prescriptions as of 10/06/2016  Medication Sig  . azaTHIOprine (IMURAN) 50 MG tablet Take 100 mg by mouth daily.  . cetirizine (ZYRTEC ALLERGY) 10 MG tablet Take 10 mg by mouth daily.  . fluticasone (FLONASE) 50 MCG/ACT nasal spray Place 1 spray into both nostrils daily as needed for allergies or rhinitis.  . folic acid (FOLVITE) 1 MG tablet Take 1 mg by mouth daily.    . methotrexate (RHEUMATREX) 2.5 MG tablet Caution:Chemotherapy. Protect from light.4 tabs by mouth two days a week.  . PrednisoLONE  Acetate (PRED FORTE OP) Apply to eye as needed.  . zolpidem (AMBIEN) 10 MG tablet Take 1 tablet (10 mg total) by mouth at bedtime as needed for sleep.  . Na Sulfate-K Sulfate-Mg Sulf 17.5-3.13-1.6 GM/180ML SOLN Take 1 kit by mouth once.  . [DISCONTINUED] fluticasone (FLONASE) 50 MCG/ACT nasal spray Place 2 sprays into both nostrils daily. (Patient taking differently: Place 2 sprays into both nostrils daily as needed. )  . [DISCONTINUED] hydrocortisone (ANUSOL-HC) 25 MG suppository Place 1 suppository (25 mg total) rectally 2 (two) times daily.  . [DISCONTINUED] traMADol (ULTRAM) 50 MG tablet Take 1 tablet (50 mg total) by mouth every 8 (eight) hours as needed.   No facility-administered encounter medications on file as of 10/06/2016.    No Known Allergies Patient Active Problem List   Diagnosis Date Noted  . Bronchitis, acute 07/05/2015  . Lower urinary tract symptoms (LUTS) 07/15/2013  . GERD (gastroesophageal reflux disease) 07/14/2013  . Annual physical exam 12/14/2011  . Pain, upper back 01/22/2011  . REITER'S DISEASE 10/22/2008  . Allergic rhinitis 12/27/2007  . Insomnia 12/27/2007  . COLONIC POLYPS, ADENOMATOUS 02/07/2007   Social History   Social History  . Marital status: Widowed    Spouse name: N/A  . Number of children: 2  . Years of education: N/A   Occupational History  . office manager     Social History Main Topics  . Smoking status: Never Smoker  . Smokeless tobacco: Never  Used  . Alcohol use Yes     Comment: socially  . Drug use: No  . Sexual activity: Not on file   Other Topics Concern  . Not on file   Social History Narrative   Married , wife dx w/ rectal cancer 2013-- going to [facility]) ; lost wife ~ 03-2014   Children 62 boy -16  girl y/o     [name] family history includes Arthritis in his brother; Dementia in his father.      Objective:    Vitals:   10/06/16 1423  BP: 120/70  Pulse: 78    Physical Exam  well-developed  white male in no acute distress, pleasant blood pressure 120/70 pulse 78, height 5 foot 9 weight 226 BMI of 33.3. HEENT; nontraumatic the cephalic EOMI PERRLA sclera anicteric Cardiovascular ;regular rate and rhythm with S1-S2 no murmur or gallop, Pulmonary; clear bilaterally, Abdomen ;soft ,nontender, nondistended bowel sounds are active there is no palpable mass or hepatosplenomegaly  Rectal; exam not repeated, and had a recent visit with PCP and rectal exam at that time revealed 3 external hemorrhoids 1 which appeared partially thrombosed. Extremities; no clubbing cyanosis or edema skin warm and dry, Neuropsych; mood and affect appropriate       Assessment & Plan:   #81 61 year old white male with history of adenomatous colon polyp, overdue for follow-up colonoscopy #2 recent symptomatic external hemorrhoids #3 Reiter's syndrome on chronic immunosuppression  Plan; Patient will be scheduled for colonoscopy with Steven Lara. Procedure discussed in detail with the patient including risks and benefits and he is agreeable to proceed. We discussed high-fiber diet and liberal fluid intake to keep his bowels regular to help decrease hemorrhoidal symptoms.. Hemorrhoids will be further examined at the time of colonoscopy.  Steven Fortin Genia Harold PA-C 10/06/2016   Cc: Steven Pai, PA-C

## 2016-10-14 ENCOUNTER — Telehealth: Payer: Self-pay | Admitting: Internal Medicine

## 2016-10-14 NOTE — Telephone Encounter (Signed)
Left message on voicemail that, because his procedure is so soon, I would leave a suprep sample up front to be picked up before five.

## 2016-10-15 ENCOUNTER — Encounter: Payer: Self-pay | Admitting: Internal Medicine

## 2016-10-15 ENCOUNTER — Ambulatory Visit (AMBULATORY_SURGERY_CENTER): Payer: BLUE CROSS/BLUE SHIELD | Admitting: Internal Medicine

## 2016-10-15 VITALS — BP 104/66 | HR 56 | Temp 98.2°F | Resp 18 | Ht 69.0 in | Wt 226.0 lb

## 2016-10-15 DIAGNOSIS — Z1211 Encounter for screening for malignant neoplasm of colon: Secondary | ICD-10-CM | POA: Diagnosis not present

## 2016-10-15 DIAGNOSIS — Z86010 Personal history of colonic polyps: Secondary | ICD-10-CM | POA: Diagnosis not present

## 2016-10-15 DIAGNOSIS — D120 Benign neoplasm of cecum: Secondary | ICD-10-CM

## 2016-10-15 DIAGNOSIS — K635 Polyp of colon: Secondary | ICD-10-CM

## 2016-10-15 MED ORDER — SODIUM CHLORIDE 0.9 % IV SOLN: 500.0000 mL | INTRAVENOUS | Status: DC

## 2016-10-15 NOTE — Patient Instructions (Signed)
  HANDOUTS GIVEN: POLYPS, DIVERTICULOSIS, HEMMORRHOIDS   YOU HAD AN ENDOSCOPIC PROCEDURE TODAY AT [facility]:   Refer to the procedure report that was given to you for any specific questions about what was found during the examination.  If the procedure report does not answer your questions, please call your gastroenterologist to clarify.  If you requested that your care partner not be given the details of your procedure findings, then the procedure report has been included in a sealed envelope for you to review at your convenience later.  YOU SHOULD EXPECT: Some feelings of bloating in the abdomen. Passage of more gas than usual.  Walking can help get rid of the air that was put into your GI tract during the procedure and reduce the bloating. If you had a lower endoscopy (such as a colonoscopy or flexible sigmoidoscopy) you may notice spotting of blood in your stool or on the toilet paper. If you underwent a bowel prep for your procedure, you may not have a normal bowel movement for a few days.  Please Note:  You might notice some irritation and congestion in your nose or some drainage.  This is from the oxygen used during your procedure.  There is no need for concern and it should clear up in a day or so.  SYMPTOMS TO REPORT IMMEDIATELY:   Following lower endoscopy (colonoscopy or flexible sigmoidoscopy):  Excessive amounts of blood in the stool  Significant tenderness or worsening of abdominal pains  Swelling of the abdomen that is new, acute  Fever of 100F or higher   For urgent or emergent issues, a gastroenterologist can be reached at any hour by calling [phone].   DIET:  We do recommend a small meal at first, but then you may proceed to your regular diet.  Drink plenty of fluids but you should avoid alcoholic beverages for 24 hours.  ACTIVITY:  You should plan to take it easy for the rest of today and you should NOT DRIVE or use heavy machinery until tomorrow  (because of the sedation medicines used during the test).    FOLLOW UP: Our staff will call the number listed on your records the next business day following your procedure to check on you and address any questions or concerns that you may have regarding the information given to you following your procedure. If we do not reach you, we will leave a message.  However, if you are feeling well and you are not experiencing any problems, there is no need to return our call.  We will assume that you have returned to your regular daily activities without incident.  If any biopsies were taken you will be contacted by phone or by letter within the next 1-3 weeks.  Please call us at [phone] if you have not heard about the biopsies in 3 weeks.    SIGNATURES/CONFIDENTIALITY: You and/or your care partner have signed paperwork which will be entered into your electronic medical record.  These signatures attest to the fact that that the information above on your After Visit Summary has been reviewed and is understood.  Full responsibility of the confidentiality of this discharge information lies with you and/or your care-partner.

## 2016-10-15 NOTE — Op Note (Signed)
[name] Procedure Date: 10/15/2016 10:27 AM MRN: [id number] Endoscopist: Docia Chuck. Henrene Pastor , MD Age: 61 Referring MD:  Date of Birth: June 27, 1956 Gender: Male Account #: [id number] Procedure:                Colonoscopy w/ cold snare x 1 Indications:              Surveillance: Personal history of colonic polyps                            (unknown histology) on last colonoscopy more than 5                            years ago, High risk colon cancer surveillance:                            Personal history of non-advanced adenoma. Index                            02-2007 Medicines:                Monitored Anesthesia Care Procedure:                Pre-Anesthesia Assessment:                           - Prior to the procedure, a History and Physical                            was performed, and patient medications and                            allergies were reviewed. The patient's tolerance of                            previous anesthesia was also reviewed. The risks                            and benefits of the procedure and the sedation                            options and risks were discussed with the patient.                            All questions were answered, and informed consent                            was obtained. Prior Anticoagulants: The patient has                            taken no previous anticoagulant or antiplatelet                            agents. ASA Grade Assessment: II - A patient with  mild systemic disease. After reviewing the risks                            and benefits, the patient was deemed in                            satisfactory condition to undergo the procedure.                           After obtaining informed consent, the colonoscope                            was passed under direct vision. Throughout the                            procedure, the patient's blood pressure,  pulse, and                            oxygen saturations were monitored continuously. The                            Model CF-HQ190L [id number]) scope was introduced                            through the anus and advanced to the the cecum,                            identified by appendiceal orifice and ileocecal                            valve. The ileocecal valve, appendiceal orifice,                            and rectum were photographed. The quality of the                            bowel preparation was excellent. The colonoscopy                            was performed without difficulty. The patient                            tolerated the procedure well. The bowel preparation                            used was SUPREP. Scope In: 10:39:51 AM Scope Out: 10:54:28 AM Scope Withdrawal Time: 0 hours 11 minutes 52 seconds  Total Procedure Duration: 0 hours 14 minutes 37 seconds  Findings:                 A 1 mm polyp was found in the cecum. The polyp was                            removed with a cold snare. Resection and retrieval  were complete.                           Multiple diverticula were found in the sigmoid                            colon.                           Internal hemorrhoids were found during                            retroflexion. The hemorrhoids were small.                           The exam was otherwise without abnormality on                            direct and retroflexion views. Complications:            No immediate complications. Estimated blood loss:                            None. Estimated Blood Loss:     Estimated blood loss: none. Impression:               - One 1 mm polyp in the cecum, removed with a cold                            snare. Resected and retrieved.                           - Diverticulosis in the sigmoid colon.                           - Internal hemorrhoids.                           - The  examination was otherwise normal on direct                            and retroflexion views. Recommendation:           - Repeat colonoscopy in 5-10 years for surveillance.                           - Patient has a contact number available for                            emergencies. The signs and symptoms of potential                            delayed complications were discussed with the                            patient. Return to normal activities tomorrow.  Written discharge instructions were provided to the                            patient.                           - Resume previous diet.                           - Continue present medications.                           - Await pathology results. Docia Chuck. Henrene Pastor, MD 10/15/2016 10:59:17 AM This report has been signed electronically.

## 2016-10-15 NOTE — Progress Notes (Signed)
Called to room to assist during endoscopic procedure.  Patient ID and intended procedure confirmed with present staff. Received instructions for my participation in the procedure from the performing physician.  

## 2016-10-15 NOTE — Progress Notes (Signed)
A/ox3 pleased with MAC, report to Karen RN 

## 2016-10-16 ENCOUNTER — Telehealth: Payer: Self-pay

## 2016-10-16 NOTE — Telephone Encounter (Signed)
  Follow up Call-  Call back number 10/15/2016  Post procedure Call Back phone  # [phone]  Permission to leave phone message Yes  Some recent data might be hidden    Patient was called for follow up after his procedure on 10/15/2016. No answer at the number given for follow up phone call. A message was left on the answering machine.

## 2016-10-22 ENCOUNTER — Encounter: Payer: Self-pay | Admitting: Internal Medicine

## 2016-12-15 DIAGNOSIS — S0100XA Unspecified open wound of scalp, initial encounter: Secondary | ICD-10-CM | POA: Diagnosis not present

## 2016-12-17 ENCOUNTER — Encounter: Payer: Self-pay | Admitting: Internal Medicine

## 2016-12-17 ENCOUNTER — Ambulatory Visit (INDEPENDENT_AMBULATORY_CARE_PROVIDER_SITE_OTHER): Payer: BLUE CROSS/BLUE SHIELD | Admitting: Internal Medicine

## 2016-12-17 ENCOUNTER — Ambulatory Visit (HOSPITAL_BASED_OUTPATIENT_CLINIC_OR_DEPARTMENT_OTHER)
Admission: RE | Admit: 2016-12-17 | Discharge: 2016-12-17 | Disposition: A | Discharge status: Home / Self Care | Payer: BLUE CROSS/BLUE SHIELD | Source: Ambulatory Visit | Attending: Internal Medicine | Admitting: Internal Medicine

## 2016-12-17 VITALS — BP 118/70 | HR 69 | Temp 97.9°F | Resp 14 | Ht 69.0 in | Wt 235.4 lb

## 2016-12-17 DIAGNOSIS — S199XXA Unspecified injury of neck, initial encounter: Secondary | ICD-10-CM | POA: Diagnosis not present

## 2016-12-17 DIAGNOSIS — M542 Cervicalgia: Secondary | ICD-10-CM | POA: Diagnosis not present

## 2016-12-17 DIAGNOSIS — W19XXXD Unspecified fall, subsequent encounter: Secondary | ICD-10-CM | POA: Diagnosis not present

## 2016-12-17 DIAGNOSIS — S0990XD Unspecified injury of head, subsequent encounter: Secondary | ICD-10-CM

## 2016-12-17 DIAGNOSIS — Z09 Encounter for follow-up examination after completed treatment for conditions other than malignant neoplasm: Secondary | ICD-10-CM | POA: Insufficient documentation

## 2016-12-17 MED ORDER — CYCLOBENZAPRINE HCL 10 MG PO TABS: 10.0000 mg | ORAL_TABLET | Freq: Every evening | ORAL | 0 refills | Status: DC | PRN

## 2016-12-17 NOTE — Patient Instructions (Signed)
Schedule an nurse visit 5 days from today for staples removal.   STOP BY THE FIRST FLOOR:  get the XR    Tylenol as needed for pain Take Flexeril, a muscle relaxant, at night. Heating pad every night to the neck Call if neck stiffness is not  gradually improving or you are not going back to your normal in the next 2 weeks  Call any time  if severe neck pain or stiffness.  Please read carefully a head injury complications.   Head Injury, Adult There are many types of head injuries. Head injuries can be as minor as a bump, or they can be more severe. More severe head injuries include:  A jarring injury to the brain (concussion).  A bruise of the brain (contusion). This means there is bleeding in the brain that can cause swelling.  A cracked skull (skull fracture).  Bleeding in the brain that collects, clots, and forms a bump (hematoma). After a head injury, you may need to be observed for a while in the emergency department or urgent care. Sometimes admission to the hospital is needed. After a head injury has happened, most problems occur within the first 24 hours, but side effects may occur up to 7-10 days after the injury. It is important to watch your condition for any changes. What are the causes? There are many possible causes of a head injury. A serious head injury may happen to someone who is in a car accident (motor vehicle collision). Other causes of major head injuries include bicycle or motorcycle accidents, sports injuries, and falls. Risk factors This condition is more likely to occur in people who:  Drink a lot of alcohol or use drugs.  Are over the age of 34.  Are at risk for falls. What are the symptoms? There are many possible symptoms of a head injury. Visible symptoms of a head injury include a bruise, bump, or bleeding at the site of the injury. Other non-visible symptoms include:  Feeling sleepy or not being able to stay awake.  Passing  out.  Headache.  Seizures.  Dizziness.  Confusion.  Memory problems.  Nausea or vomiting. Other possible symptoms that may develop after the head injury include:  Poor attention and concentration.  Fatigue or tiring easily.  Irritability.  Being uncomfortable around bright lights or loud noises.  Anxiety or depression.  Disturbed sleep. How is this diagnosed? This condition can usually be diagnosed based on your symptoms, a description of the injury, and a physical exam. You may also have imaging tests done, such as a CT scan or MRI. You will also be closely watched. How is this treated? Treatment for this condition depends on the severity and type of injury you have. The main goal of treatment is to prevent complications and allow the brain time to heal. For mild head injury, you may be sent home and treatment may include:  Observation. A responsible adult should stay with you for 24 hours after your injury and check on you often.  Physical rest.  Brain rest.  Pain medicines. For severe brain injury, treatment may include:  Close observation. This includes hospitalization with frequent physical exams. You may need to go to a hospital that specializes in head injury.  Pain medicines.  Breathing support. This may include using a ventilator.  Managing the pressure inside the brain (intracranial pressure, or ICP). This may include:  Monitoring the ICP.  Giving medicines to decrease the ICP.  Positioning you to decrease  the ICP.  Medicine to prevent seizures.  Surgery to stop bleeding or to remove blood clots (craniotomy).  Surgery to remove part of the skull (decompressive craniectomy). This allows room for the brain to swell. Follow these instructions at home: Activity  Rest as much as possible and avoid activities that are physically hard or tiring.  Make sure you get enough sleep.  Limit activities that require a lot of thought or attention, such  as:  Watching TV.  Playing memory games and puzzles.  Job-related work or homework.  Working on [employer], and texting.  Avoid activities that could cause another head injury, such as playing sports, until your health care provider approves. Having another head injury, especially before the first one has healed, can be dangerous.  Ask your health care provider when it is safe for you to return to your regular activities, including work or school. Ask your health care provider for a step-by-step plan for gradually returning to activities.  Ask your health care provider when you can drive, ride a bicycle, or use heavy machinery. Your ability to react may be slower after a brain injury. Never do these activities if you are dizzy. Lifestyle  Do not drink alcohol until your health care provider approves, and avoid drug use. Alcohol and certain drugs may slow your recovery and can put you at risk of further injury.  If it is harder than usual to remember things, write them down.  If you are easily distracted, try to do one thing at a time.  Talk with family members or close friends when making important decisions.  Tell your friends, family, a trusted colleague, and work Fre[employer] about your injury, symptoms, and restrictions. Have them watch for any new or worsening problems. General instructions  Take over-the-counter and prescription medicines only as told by your health care provider.  Have someone stay with you for 24 hours after your head injury. This person should watch you for any changes in your symptoms and be ready to seek medical help, as needed.  Keep all follow-up visits as told by your health care provider. This is important. Prevention  Work on improving your balance and strength to avoid falls.  Wear a seatbelt when you are in a moving vehicle.  Wear a helmet when riding a bicycle, skiing, or doing any other sport or activity that has a risk of  injury.  Drink alcohol only in moderation.  Take safety measures in your home, such as:  Removing clutter and tripping hazards from floors and stairways.  Using grab bars in bathrooms and handrails by stairs.  Placing non-slip mats on floors and in bathtubs.  Improving lighting in dim areas. Get help right away if:  You have:  A severe headache that is not helped by medicine.  Trouble walking, have weakness in your arms and legs, or lose your balance.  Clear or bloody fluid coming from your nose or ears.  Changes in your vision.  A seizure.  You vomit.  Your symptoms get worse.  Your speech is slurred.  You pass out.  You are sleepier and have trouble staying awake.  Your pupils change size. These symptoms may represent a serious problem that is an emergency. Do not wait to see if the symptoms will go away. Get medical help right away. Call your local emergency services (911 in the [place] Do not drive yourself to the hospital. This information is not intended to replace advice given to you  by your health care provider. Make sure you discuss any questions you have with your health care provider. Document Released: 09/21/2005 Document Revised: 04/17/2016 Document Reviewed: 03/31/2016 Elsevier Interactive Patient Education  2017 [employer].

## 2016-12-17 NOTE — Progress Notes (Signed)
Pre visit review using our clinic review tool, if applicable. No additional management support is needed unless otherwise documented below in the visit note. 

## 2016-12-17 NOTE — Assessment & Plan Note (Signed)
Fall with head and neck injury: No evidence of major head injury on clinical grounds due to the lack of HAs, nausea, dizziness; precautions discussed, see AVS. His neck is stiff, he has Reiter's and has neck stiffness at baseline, currently stiffness is  is mild to moderately worse than usual. Will get a x-ray, rx Tylenol Flexeril. If not better soon will consider po steroids or a referral. See instructions. Scalp wound: Wound edges do not seem to be touching each other but there is no evidence of infection, asked patient to come back in 5 days for staple removal.

## 2016-12-17 NOTE — Progress Notes (Signed)
Subjective:    Patient ID: Steven Lara, male    DOB: [birth year], 61 y.o.   MRN: [id number]  DOS:  12/17/2016 Type of visit - description : acute Interval history: On 12/15/2016, he was walking towards his office, slipped on ice, fell, injured her his head: Went to urgent care, 8 staples were placed. At the time of the incident he did not loss consciousness. Here for follow-up. The area of the injury seems to be healing well without pain, redness or discharge. Mentally he is doing well with no headaches per se, confusion, nausea, vomiting or dizziness. He did notice his neck is more stiff than usual. He has some on and off pain at the left shoulder as well. Denies any paresthesias of the extremities.  Review of Systems See above  Past Medical History:  Diagnosis Date  . Allergic rhinitis   . Allergy    seasonal  . Arthritis   . Bronchitis, acute 07/05/2015  . Colon polyps    02/07/2007 Next Due: 02/2012 Results: Adenomatous Polyp (Dr Henrene Pastor)   . Diverticulosis   . GERD (gastroesophageal reflux disease) 07/14/2013  . Insomnia   . Mole (skin)    sees derm  . Reiter's syndrome ([place])    dx remotely and again 12/2008 (L eye iritys R foot problems)    Past Surgical History:  Procedure Laterality Date  . COLONOSCOPY    . NO PAST SURGERIES    . POLYPECTOMY      Social History   Social History  . Marital status: Widowed    Spouse name: N/A  . Number of children: 2  . Years of education: N/A   Occupational History  . office manager     Social History Main Topics  . Smoking status: Never Smoker  . Smokeless tobacco: Never Used  . Alcohol use Yes     Comment: socially  . Drug use: No  . Sexual activity: Not on file   Other Topics Concern  . Not on file   Social History Narrative   Married , wife dx w/ rectal cancer 2013-- going to [facility]) ; lost wife ~ 03-2014   Children 60 boy -19  girl y/o       Allergies as of 12/17/2016   No Known  Allergies     Medication List       Accurate as of 12/17/16  9:05 AM. Always use your most recent med list.          azaTHIOprine 50 MG tablet Commonly known as:  IMURAN Take 100 mg by mouth daily.   fluticasone 50 MCG/ACT nasal spray Commonly known as:  FLONASE Place 1 spray into both nostrils daily as needed for allergies or rhinitis.   folic acid 1 MG tablet Commonly known as:  FOLVITE Take 1 mg by mouth daily.   methotrexate 2.5 MG tablet Commonly known as:  RHEUMATREX Caution:Chemotherapy. Protect from light.4 tabs by mouth two days a week.   PRED FORTE OP Apply to eye as needed.   zolpidem 10 MG tablet Commonly known as:  AMBIEN Take 1 tablet (10 mg total) by mouth at bedtime as needed for sleep.   ZYRTEC ALLERGY 10 MG tablet Generic drug:  cetirizine Take 10 mg by mouth daily.          Objective:   Physical Exam  HENT:  Head:    Neck:     BP 118/70 (BP Location: Left Arm, Patient Position: Sitting, Cuff Size:  Normal)   Pulse 69   Temp 97.9 F (36.6 C) (Oral)   Resp 14   Ht 5\' 9"  (1.753 m)   Wt 235 lb 6 oz (106.8 kg)   SpO2 97%   BMI 34.76 kg/m  General:   Well developed, well nourished . NAD.  Neck: No TTP at the cervical spine. Range of motion is definitely decreased, particularly when he tries to extend the neck. HEENT:  Normocephalic . Face symmetric, atraumatic  Skin: Not pale. Not jaundice MSK: Shoulders symmetric and full range of motion Neurologic:  alert & oriented X3.  Speech normal, gait at baseline, motor and DTRs symmetric. EOMI. Face symmetric. Psych--  Cognition and judgment appear intact.  Cooperative with normal attention span and concentration.  Behavior appropriate. No anxious or depressed appearing.      Assessment & Plan:     Assessment Insomnia Reiter's syndrome: dx remotely and again 2010 ( left eye iritis,   foot pain), Dr Charlestine Night  GERD Seasonal allergies  Moles: Sees dermatology  PLAN Fall with  head and neck injury: No evidence of major head injury on clinical grounds due to the lack of HAs, nausea, dizziness; precautions discussed, see AVS. His neck is stiff, he has Reiter's and has neck stiffness at baseline, currently stiffness is  is mild to moderately worse than usual. Will get a x-ray, rx Tylenol Flexeril. If not better soon will consider po steroids or a referral. See instructions. Scalp wound: Wound edges do not seem to be touching each other but there is no evidence of infection, asked patient to come back in 5 days for staple removal.

## 2016-12-22 ENCOUNTER — Telehealth: Payer: Self-pay | Admitting: Internal Medicine

## 2016-12-22 ENCOUNTER — Ambulatory Visit (INDEPENDENT_AMBULATORY_CARE_PROVIDER_SITE_OTHER): Payer: BLUE CROSS/BLUE SHIELD | Admitting: Internal Medicine

## 2016-12-22 DIAGNOSIS — W19XXXD Unspecified fall, subsequent encounter: Secondary | ICD-10-CM

## 2016-12-22 NOTE — Telephone Encounter (Signed)
Spoke w/ Pt, informed of recommendations. Pt verbalized understanding.  

## 2016-12-22 NOTE — Telephone Encounter (Signed)
Please advise 

## 2016-12-22 NOTE — Telephone Encounter (Signed)
He can clean the area with shampoo when he takes a shower, just regular shampoo, then pat the area dry. I would prefer him not to have a hair cut until  the next week or so until the area looks more healed.

## 2016-12-22 NOTE — Telephone Encounter (Signed)
Relation to HF:SFSE Call back [phone]   Reason for call:  Patient would like to know how or if he should clean the stample removal area and when can he get a hair cut, please advise patient directly,

## 2016-12-22 NOTE — Progress Notes (Signed)
Pre visit review using our clinic review tool, if applicable. No additional management support is needed unless otherwise documented below in the visit note.  Pt here for staple removal.

## 2016-12-22 NOTE — Progress Notes (Signed)
Here for staples removal. The area had a lot of debris, area was cleaned with alcohol and multiple staples removed. The wound is not well healed. Plan: Keep the area clean shampoo and pat it dry. If he notices any redness, swelling will call. The healing process should take 10 days to 2 additional weeks. If he feels anything unusual needs to come back (because the amount of debris I may have missed a staple) Kathlene November, MD

## 2016-12-25 ENCOUNTER — Ambulatory Visit: Payer: BLUE CROSS/BLUE SHIELD | Admitting: Internal Medicine

## 2016-12-25 ENCOUNTER — Telehealth: Payer: Self-pay | Admitting: Internal Medicine

## 2016-12-25 NOTE — Telephone Encounter (Signed)
No Charge 

## 2016-12-25 NOTE — Telephone Encounter (Signed)
Patient lvm cancelling 9:30am appointment due to patient thinking he had a staple left in he's head, patient clarified its only a scab, charge or no charge

## 2017-01-06 ENCOUNTER — Telehealth: Payer: Self-pay

## 2017-01-06 MED ORDER — ZOLPIDEM TARTRATE 10 MG PO TABS: 10.0000 mg | ORAL_TABLET | Freq: Every evening | ORAL | 2 refills | Status: DC | PRN

## 2017-01-06 NOTE — Telephone Encounter (Signed)
Rx faxed to [facility] pharmacy.  

## 2017-01-06 NOTE — Telephone Encounter (Signed)
Okay #30, 2 refills

## 2017-01-06 NOTE — Telephone Encounter (Signed)
Rx printed, awaiting MD signature.  

## 2017-01-06 NOTE — Telephone Encounter (Signed)
Pt is requesting refill on Ambien.  Last OV: 12/22/2016 Last Fill: 07/08/2016 #30 and 5RF UDS: Not needed for Ambien per PCP  Please advise.

## 2017-01-27 DIAGNOSIS — M0230 Reiter's disease, unspecified site: Secondary | ICD-10-CM | POA: Diagnosis not present

## 2017-01-27 DIAGNOSIS — M542 Cervicalgia: Secondary | ICD-10-CM | POA: Diagnosis not present

## 2017-01-27 DIAGNOSIS — M545 Low back pain: Secondary | ICD-10-CM | POA: Diagnosis not present

## 2017-01-27 DIAGNOSIS — H209 Unspecified iridocyclitis: Secondary | ICD-10-CM | POA: Diagnosis not present

## 2017-02-05 DIAGNOSIS — M0239 Reiter's disease, multiple sites: Secondary | ICD-10-CM | POA: Diagnosis not present

## 2017-02-05 DIAGNOSIS — D225 Melanocytic nevi of trunk: Secondary | ICD-10-CM | POA: Diagnosis not present

## 2017-02-05 DIAGNOSIS — D485 Neoplasm of uncertain behavior of skin: Secondary | ICD-10-CM | POA: Diagnosis not present

## 2017-02-05 DIAGNOSIS — L820 Inflamed seborrheic keratosis: Secondary | ICD-10-CM | POA: Diagnosis not present

## 2017-02-05 DIAGNOSIS — T07XXXA Unspecified multiple injuries, initial encounter: Secondary | ICD-10-CM | POA: Diagnosis not present

## 2017-03-16 DIAGNOSIS — S338XXA Sprain of other parts of lumbar spine and pelvis, initial encounter: Secondary | ICD-10-CM | POA: Diagnosis not present

## 2017-03-16 DIAGNOSIS — S80811A Abrasion, right lower leg, initial encounter: Secondary | ICD-10-CM | POA: Diagnosis not present

## 2017-03-16 DIAGNOSIS — S50311A Abrasion of right elbow, initial encounter: Secondary | ICD-10-CM | POA: Diagnosis not present

## 2017-04-08 ENCOUNTER — Other Ambulatory Visit: Payer: Self-pay | Admitting: Internal Medicine

## 2017-04-08 NOTE — Telephone Encounter (Signed)
Okay #30, 5 refills

## 2017-04-08 NOTE — Telephone Encounter (Signed)
Rx printed, awaiting MD signature.  

## 2017-04-08 NOTE — Telephone Encounter (Signed)
Pt is requesting refill on Ambien 10mg .  Last OV: 12/22/2016 Last fill: 01/06/2017 #30 and 2 RF UDS: Not needed for Ambien per PCP   Please advise.

## 2017-04-08 NOTE — Telephone Encounter (Signed)
Rx faxed to [facility] pharmacy.  

## 2017-04-13 ENCOUNTER — Ambulatory Visit (INDEPENDENT_AMBULATORY_CARE_PROVIDER_SITE_OTHER): Payer: BLUE CROSS/BLUE SHIELD | Admitting: Internal Medicine

## 2017-04-13 ENCOUNTER — Encounter: Payer: Self-pay | Admitting: Internal Medicine

## 2017-04-13 VITALS — BP 124/76 | HR 79 | Temp 97.6°F | Resp 14 | Ht 69.0 in | Wt 224.1 lb

## 2017-04-13 DIAGNOSIS — Z114 Encounter for screening for human immunodeficiency virus [HIV]: Secondary | ICD-10-CM | POA: Diagnosis not present

## 2017-04-13 DIAGNOSIS — Z1159 Encounter for screening for other viral diseases: Secondary | ICD-10-CM | POA: Diagnosis not present

## 2017-04-13 DIAGNOSIS — Z0000 Encounter for general adult medical examination without abnormal findings: Secondary | ICD-10-CM | POA: Diagnosis not present

## 2017-04-13 LAB — COMPREHENSIVE METABOLIC PANEL
ALK PHOS: 69 U/L (ref 39–117)
ALT: 12 U/L (ref 0–53)
AST: 16 U/L (ref 0–37)
Albumin: 4 g/dL (ref 3.5–5.2)
BILIRUBIN TOTAL: 0.7 mg/dL (ref 0.2–1.2)
BUN: 23 mg/dL (ref 6–23)
CALCIUM: 9.1 mg/dL (ref 8.4–10.5)
CO2: 22 mEq/L (ref 19–32)
CREATININE: 0.79 mg/dL (ref 0.40–1.50)
Chloride: 107 mEq/L (ref 96–112)
GFR: 106.27 mL/min (ref >60.00)
Glucose, Bld: 108 mg/dL — ABNORMAL HIGH (ref 70–99)
Potassium: 4.3 mEq/L (ref 3.5–5.1)
Sodium: 138 mEq/L (ref 135–145)
TOTAL PROTEIN: 6.7 g/dL (ref 6.0–8.3)

## 2017-04-13 LAB — LIPID PANEL
CHOL/HDL RATIO: 4
CHOLESTEROL: 222 mg/dL — AB (ref 0–200)
HDL: 53.5 mg/dL (ref >39.00)
LDL Cholesterol: 147 mg/dL — ABNORMAL HIGH (ref 0–99)
NonHDL: 168.14
TRIGLYCERIDES: 108 mg/dL (ref 0.0–149.0)
VLDL: 21.6 mg/dL (ref 0.0–40.0)

## 2017-04-13 LAB — CBC WITH DIFFERENTIAL/PLATELET
BASOS ABS: 0 10*3/uL (ref 0.0–0.1)
Basophils Relative: 0.3 % (ref 0.0–3.0)
EOS PCT: 2.8 % (ref 0.0–5.0)
Eosinophils Absolute: 0.2 10*3/uL (ref 0.0–0.7)
HEMATOCRIT: 42.6 % (ref 39.0–52.0)
Hemoglobin: 14.9 g/dL (ref 13.0–17.0)
Lymphocytes Relative: 14.7 % (ref 12.0–46.0)
Lymphs Abs: 1.3 10*3/uL (ref 0.7–4.0)
MCHC: 35.1 g/dL (ref 30.0–36.0)
MCV: 98 fl (ref 78.0–100.0)
MONOS PCT: 10.7 % (ref 3.0–12.0)
Monocytes Absolute: 0.9 10*3/uL (ref 0.1–1.0)
NEUTROS ABS: 6.2 10*3/uL (ref 1.4–7.7)
Neutrophils Relative %: 71.5 % (ref 43.0–77.0)
PLATELETS: 270 10*3/uL (ref 150.0–400.0)
RBC: 4.35 Mil/uL (ref 4.22–5.81)
RDW: 13.3 % (ref 11.5–15.5)
WBC: 8.6 10*3/uL (ref 4.0–10.5)

## 2017-04-13 LAB — PSA: PSA: 1.43 ng/mL (ref 0.10–4.00)

## 2017-04-13 NOTE — Assessment & Plan Note (Signed)
PLAN Insomnia: Controlled on Ambien Reiter's syndrome: Last visit with Dr. Charlestine Night 2 months ago, to see Dr. Naida Sleight this month. Hip contusion: has a resolving hematoma. Hip rotation is normal, patient is recovering well. Fall prevention discussed RTC one year

## 2017-04-13 NOTE — Progress Notes (Signed)
Pre visit review using our clinic review tool, if applicable. No additional management support is needed unless otherwise documented below in the visit note. 

## 2017-04-13 NOTE — Assessment & Plan Note (Addendum)
-  adacel 03-16-2017 at the [facility] after a fall; PNM shot 2011 ; prevnar-- 2016; shingrex discussed  - CCS: Colonoscopy (Dr Henrene Pastor)  02/07/2007, Cscope 10-2016, 10 years per GI letter   -Prostate cancer screening: DRE wnl, check a PSA  -Labs: CMP, FLP, CBC, PSA, HIV, hep C -Exercise:  Remains active, advise about fall prevention. Diet discussed
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
## 2017-04-13 NOTE — Patient Instructions (Signed)
GO TO THE LAB : Get the blood work     GO TO THE FRONT DESK Schedule your next appointment for a  physical exam in one year  

## 2017-04-14 LAB — HEPATITIS C ANTIBODY: HCV Ab: NEGATIVE

## 2017-04-14 LAB — HIV ANTIBODY (ROUTINE TESTING W REFLEX): HIV: NONREACTIVE

## 2017-04-22 DIAGNOSIS — M450 Ankylosing spondylitis of multiple sites in spine: Secondary | ICD-10-CM | POA: Diagnosis not present

## 2017-04-22 DIAGNOSIS — M5030 Other cervical disc degeneration, unspecified cervical region: Secondary | ICD-10-CM | POA: Diagnosis not present

## 2017-07-29 DIAGNOSIS — M450 Ankylosing spondylitis of multiple sites in spine: Secondary | ICD-10-CM | POA: Diagnosis not present

## 2017-07-29 DIAGNOSIS — H209 Unspecified iridocyclitis: Secondary | ICD-10-CM | POA: Diagnosis not present

## 2017-07-29 DIAGNOSIS — M5030 Other cervical disc degeneration, unspecified cervical region: Secondary | ICD-10-CM | POA: Diagnosis not present

## 2017-09-23 DIAGNOSIS — H20021 Recurrent acute iridocyclitis, right eye: Secondary | ICD-10-CM | POA: Diagnosis not present

## 2017-10-06 ENCOUNTER — Telehealth: Payer: Self-pay

## 2017-10-06 MED ORDER — ZOLPIDEM TARTRATE 10 MG PO TABS: 10.0000 mg | ORAL_TABLET | Freq: Every evening | ORAL | 5 refills | Status: DC | PRN

## 2017-10-06 NOTE — Telephone Encounter (Signed)
Pt is requesting refill on Ambien 10mg .   Last OV: 04/13/2017 Last Fill: 04/08/2017 #30 and 5RF UDS; Not needed for Ambien per PCP  NCCR printed; no issues noted.   Please advise.

## 2017-10-06 NOTE — Telephone Encounter (Signed)
sent 

## 2017-10-07 DIAGNOSIS — H20021 Recurrent acute iridocyclitis, right eye: Secondary | ICD-10-CM | POA: Diagnosis not present

## 2017-10-29 DIAGNOSIS — M450 Ankylosing spondylitis of multiple sites in spine: Secondary | ICD-10-CM | POA: Diagnosis not present

## 2017-11-12 DIAGNOSIS — H20021 Recurrent acute iridocyclitis, right eye: Secondary | ICD-10-CM | POA: Diagnosis not present

## 2017-11-15 DIAGNOSIS — H20021 Recurrent acute iridocyclitis, right eye: Secondary | ICD-10-CM | POA: Diagnosis not present

## 2017-11-22 DIAGNOSIS — H20021 Recurrent acute iridocyclitis, right eye: Secondary | ICD-10-CM | POA: Diagnosis not present

## 2017-12-01 DIAGNOSIS — B309 Viral conjunctivitis, unspecified: Secondary | ICD-10-CM | POA: Diagnosis not present

## 2017-12-22 DIAGNOSIS — H35351 Cystoid macular degeneration, right eye: Secondary | ICD-10-CM | POA: Diagnosis not present

## 2017-12-27 DIAGNOSIS — H209 Unspecified iridocyclitis: Secondary | ICD-10-CM | POA: Diagnosis not present

## 2017-12-27 DIAGNOSIS — M5030 Other cervical disc degeneration, unspecified cervical region: Secondary | ICD-10-CM | POA: Diagnosis not present

## 2017-12-27 DIAGNOSIS — M450 Ankylosing spondylitis of multiple sites in spine: Secondary | ICD-10-CM | POA: Diagnosis not present

## 2017-12-27 DIAGNOSIS — R5383 Other fatigue: Secondary | ICD-10-CM | POA: Diagnosis not present

## 2017-12-27 LAB — HM HEPATITIS C SCREENING LAB: HM Hepatitis Screen: NEGATIVE

## 2017-12-29 DIAGNOSIS — H20021 Recurrent acute iridocyclitis, right eye: Secondary | ICD-10-CM | POA: Diagnosis not present

## 2017-12-31 DIAGNOSIS — H35372 Puckering of macula, left eye: Secondary | ICD-10-CM | POA: Diagnosis not present

## 2017-12-31 DIAGNOSIS — H44113 Panuveitis, bilateral: Secondary | ICD-10-CM | POA: Diagnosis not present

## 2017-12-31 DIAGNOSIS — H2513 Age-related nuclear cataract, bilateral: Secondary | ICD-10-CM | POA: Diagnosis not present

## 2018-01-25 DIAGNOSIS — H35372 Puckering of macula, left eye: Secondary | ICD-10-CM | POA: Diagnosis not present

## 2018-01-25 DIAGNOSIS — H2513 Age-related nuclear cataract, bilateral: Secondary | ICD-10-CM | POA: Diagnosis not present

## 2018-01-25 DIAGNOSIS — H44113 Panuveitis, bilateral: Secondary | ICD-10-CM | POA: Diagnosis not present

## 2018-02-01 DIAGNOSIS — H209 Unspecified iridocyclitis: Secondary | ICD-10-CM | POA: Diagnosis not present

## 2018-02-01 DIAGNOSIS — M450 Ankylosing spondylitis of multiple sites in spine: Secondary | ICD-10-CM | POA: Diagnosis not present

## 2018-02-01 DIAGNOSIS — R5383 Other fatigue: Secondary | ICD-10-CM | POA: Diagnosis not present

## 2018-02-01 DIAGNOSIS — M5030 Other cervical disc degeneration, unspecified cervical region: Secondary | ICD-10-CM | POA: Diagnosis not present

## 2018-03-08 DIAGNOSIS — H35372 Puckering of macula, left eye: Secondary | ICD-10-CM | POA: Diagnosis not present

## 2018-03-08 DIAGNOSIS — H44113 Panuveitis, bilateral: Secondary | ICD-10-CM | POA: Diagnosis not present

## 2018-03-08 DIAGNOSIS — H2513 Age-related nuclear cataract, bilateral: Secondary | ICD-10-CM | POA: Diagnosis not present

## 2018-03-30 ENCOUNTER — Telehealth: Payer: Self-pay | Admitting: Internal Medicine

## 2018-03-30 NOTE — Telephone Encounter (Signed)
Pt is requesting refill on Ambien.   Last OV: 04/13/2017 Last Fill: 10/06/2017 #30 and 5RF UDS: Not needed for Ambien per PCP  Please advise.

## 2018-03-30 NOTE — Telephone Encounter (Signed)
sent 

## 2018-03-31 DIAGNOSIS — R5383 Other fatigue: Secondary | ICD-10-CM | POA: Diagnosis not present

## 2018-03-31 DIAGNOSIS — H209 Unspecified iridocyclitis: Secondary | ICD-10-CM | POA: Diagnosis not present

## 2018-03-31 DIAGNOSIS — M450 Ankylosing spondylitis of multiple sites in spine: Secondary | ICD-10-CM | POA: Diagnosis not present

## 2018-03-31 DIAGNOSIS — M5030 Other cervical disc degeneration, unspecified cervical region: Secondary | ICD-10-CM | POA: Diagnosis not present

## 2018-05-31 ENCOUNTER — Other Ambulatory Visit: Payer: Self-pay | Admitting: Internal Medicine

## 2018-05-31 NOTE — Telephone Encounter (Signed)
Last zolpidem RX: 03/30/18, #30 x 1 refill Last OV:  04/13/17 Next OV: was due 04/13/18 and is past due UDS: not on file CSC: not on file CSR: No discrepancies identified

## 2018-05-31 NOTE — Telephone Encounter (Signed)
Prescription sent for 1 month supply.  Please notify the patient via letter or phone note: He is due for a checkup.

## 2018-06-07 NOTE — Telephone Encounter (Signed)
Pt scheduled CPE for 06-22-2018 at 1:00. Done

## 2018-06-22 ENCOUNTER — Ambulatory Visit (INDEPENDENT_AMBULATORY_CARE_PROVIDER_SITE_OTHER): Payer: BLUE CROSS/BLUE SHIELD | Admitting: Internal Medicine

## 2018-06-22 ENCOUNTER — Encounter: Payer: Self-pay | Admitting: Internal Medicine

## 2018-06-22 VITALS — BP 124/78 | HR 50 | Temp 97.8°F | Resp 16 | Ht 69.0 in | Wt 226.4 lb

## 2018-06-22 DIAGNOSIS — Z0000 Encounter for general adult medical examination without abnormal findings: Secondary | ICD-10-CM | POA: Diagnosis not present

## 2018-06-22 LAB — LIPID PANEL
CHOLESTEROL: 180 mg/dL (ref 0–200)
HDL: 46.9 mg/dL (ref >39.00)
LDL Cholesterol: 117 mg/dL — ABNORMAL HIGH (ref 0–99)
NONHDL: 133.31
Total CHOL/HDL Ratio: 4
Triglycerides: 80 mg/dL (ref 0.0–149.0)
VLDL: 16 mg/dL (ref 0.0–40.0)

## 2018-06-22 LAB — HEMOGLOBIN A1C: HEMOGLOBIN A1C: 5.3 % (ref 4.6–6.5)

## 2018-06-22 LAB — TSH: TSH: 2.02 u[IU]/mL (ref 0.35–4.50)

## 2018-06-22 NOTE — Patient Instructions (Signed)
GO TO THE LAB : Get the blood work     GO TO THE FRONT DESK Schedule your next appointment for a  Physical in 1 year  Please consider get [facility] a shingles shot. Discuss with Dr Naida Sleight

## 2018-06-22 NOTE — Progress Notes (Signed)
Pre visit review using our clinic review tool, if applicable. No additional management support is needed unless otherwise documented below in the visit note. 

## 2018-06-22 NOTE — Progress Notes (Signed)
Subjective:    Patient ID: Steven Lara, male    DOB: 12/12/1955, 62 y.o.   MRN: [id number]  DOS:  06/22/2018 Type of visit - description : cpx Interval history: Since the last office visit, he has some problems with iritis, all that is better.  He sees rheumatology and ophthalmology regularly.   Review of Systems For the last 2 days, the right eye has been slightly red, no other eye symptoms, he is better today.  Other than above, a 14 point review of systems is negative    Past Medical History:  Diagnosis Date  . Allergy    seasonal  . Arthritis   . Bronchitis, acute 07/05/2015  . Colon polyps    02/07/2007 Next Due: 02/2012 Results: Adenomatous Polyp (Dr Henrene Pastor)   . Diverticulosis   . GERD (gastroesophageal reflux disease) 07/14/2013  . Insomnia   . Mole (skin)    sees derm  . Reiter's syndrome ([place])    dx remotely and again 12/2008 (L eye iritys R foot problems)    Past Surgical History:  Procedure Laterality Date  . COLONOSCOPY W/ POLYPECTOMY      Social History   Socioeconomic History  . Marital status: Widowed    Spouse name: Not on file  . Number of children: 2  . Years of education: Not on file  . Highest education level: Not on file  Occupational History  . Occupation: Glass blower/designer   Social Needs  . Financial resource strain: Not on file  . Food insecurity:    Worry: Not on file    Inability: Not on file  . Transportation needs:    Medical: Not on file    Non-medical: Not on file  Tobacco Use  . Smoking status: Never Smoker  . Smokeless tobacco: Never Used  Substance and Sexual Activity  . Alcohol use: Yes    Comment: socially  . Drug use: No  . Sexual activity: Not on file  Lifestyle  . Physical activity:    Days per week: Not on file    Minutes per session: Not on file  . Stress: Not on file  Relationships  . Social connections:    Talks on phone: Not on file    Gets together: Not on file    Attends religious service: Not on file      Active member of club or organization: Not on file    Attends meetings of clubs or organizations: Not on file    Relationship status: Not on file  . Intimate partner violence:    Fear of current or ex partner: Not on file    Emotionally abused: Not on file    Physically abused: Not on file    Forced sexual activity: Not on file  Other Topics Concern  . Not on file  Social History Narrative   Married , wife dx w/ rectal cancer 2013-- going to [facility]) ; lost wife ~ 03-2014   Children 39 boy -67  girl     Family History  Problem Relation Age of Onset  . Dementia Father   . Arthritis Brother   . COPD Mother   . Heart attack Neg Hx   . Diabetes Neg Hx   . Colon cancer Neg Hx   . Prostate cancer Neg Hx        apparently F and BPH      Allergies as of 06/22/2018   No Known Allergies     Medication  List        Accurate as of 06/22/18 11:59 PM. Always use your most recent med list.          azaTHIOprine 50 MG tablet Commonly known as:  IMURAN Take 100 mg by mouth daily.   fluticasone 50 MCG/ACT nasal spray Commonly known as:  FLONASE Place 1 spray into both nostrils daily as needed for allergies or rhinitis.   folic acid 1 MG tablet Commonly known as:  FOLVITE Take 1 mg by mouth daily.   HUMIRA 40 MG/0.4ML Pskt Generic drug:  Adalimumab Inject into the skin.   methotrexate 2.5 MG tablet Commonly known as:  RHEUMATREX Caution:Chemotherapy. Protect from light.4 tabs by mouth two days a week.   PRED FORTE OP Apply to eye as needed.   zolpidem 10 MG tablet Commonly known as:  AMBIEN TAKE 1 TABLET(10 MG) BY MOUTH AT BEDTIME AS NEEDED FOR SLEEP   ZYRTEC ALLERGY 10 MG tablet Generic drug:  cetirizine Take 10 mg by mouth daily.          Objective:   Physical Exam BP 124/78 (BP Location: Left Arm, Patient Position: Sitting, Cuff Size: Small)   Pulse (!) 50   Temp 97.8 F (36.6 C) (Oral)   Resp 16   Ht 5\' 9"  (1.753 m)   Wt 226 lb 6 oz  (102.7 kg)   SpO2 96%   BMI 33.43 kg/m  General: Well developed, NAD, see BMI.  Neck: No  thyromegaly  HEENT:  Normocephalic . Face symmetric, atraumatic Right conjunctiva slightly red, no discharge, anterior chambers normal bilaterally.  EOMI.  No eylid swelling  lungs:  CTA B Normal respiratory effort, no intercostal retractions, no accessory muscle use. Heart: RRR,  no murmur.  No pretibial edema bilaterally  Abdomen:  Not distended, soft, non-tender. No rebound or rigidity.   Skin: Exposed areas without rash. Not pale. Not jaundice Neurologic:  alert & oriented X3.  Speech normal, gait appropriate for age and unassisted Strength symmetric and appropriate for age.  Psych: Cognition and judgment appear intact.  Cooperative with normal attention span and concentration.  Behavior appropriate. No anxious or depressed appearing.     Assessment & Plan:   Assessment Insomnia Reiter's syndrome: dx remotely and again 2010 ( left eye iritis, foot pain), Dr Charlestine Night , sees opht GERD Seasonal allergies  Moles: Sees dermatology  PLAN Insomnia: On Ambien, call for refill as needed Reiter's syndrome: Closely follow-up by Dr. Elpidio Galea, they are weaning him off methotrexate, on Humira and Imuran.  Had a number of eye issues lately, doing better. RTC 1 year

## 2018-06-22 NOTE — Assessment & Plan Note (Signed)
-  adacel 03-16-2017 at the [facility] after a fall; PNM shot 2011 ; prevnar-- 2016; shingrex discussed  - CCS: Colonoscopy (Dr Henrene Pastor)  02/07/2007, Cscope 10-2016, 10 years per GI letter   -Prostate cancer screening: DRE -PSA wnl 2018 -Labs: FLP TSH A1C -Exercise: trying to exercise more, doing ok w/  diet

## 2018-06-23 NOTE — Assessment & Plan Note (Signed)
Insomnia: On Ambien, call for refill as needed Reiter's syndrome: Closely follow-up by Dr. Elpidio Galea, they are weaning him off methotrexate, on Humira and Imuran.  Had a number of eye issues lately, doing better. RTC 1 year

## 2018-06-28 DIAGNOSIS — H15001 Unspecified scleritis, right eye: Secondary | ICD-10-CM | POA: Diagnosis not present

## 2018-06-28 DIAGNOSIS — H2513 Age-related nuclear cataract, bilateral: Secondary | ICD-10-CM | POA: Diagnosis not present

## 2018-06-28 DIAGNOSIS — H35372 Puckering of macula, left eye: Secondary | ICD-10-CM | POA: Diagnosis not present

## 2018-06-28 DIAGNOSIS — H44113 Panuveitis, bilateral: Secondary | ICD-10-CM | POA: Diagnosis not present

## 2018-06-30 ENCOUNTER — Telehealth: Payer: Self-pay | Admitting: Internal Medicine

## 2018-06-30 NOTE — Telephone Encounter (Signed)
Pt is requesting refill on Ambien.   Last OV: 06/22/2018 Last Fill: 05/31/2018 #30 and 0RF UDS: None

## 2018-06-30 NOTE — Telephone Encounter (Signed)
sent 

## 2018-07-06 DIAGNOSIS — M450 Ankylosing spondylitis of multiple sites in spine: Secondary | ICD-10-CM | POA: Diagnosis not present

## 2018-07-06 DIAGNOSIS — R5383 Other fatigue: Secondary | ICD-10-CM | POA: Diagnosis not present

## 2018-07-06 DIAGNOSIS — H209 Unspecified iridocyclitis: Secondary | ICD-10-CM | POA: Diagnosis not present

## 2018-07-06 DIAGNOSIS — M5030 Other cervical disc degeneration, unspecified cervical region: Secondary | ICD-10-CM | POA: Diagnosis not present

## 2018-08-23 DIAGNOSIS — H44113 Panuveitis, bilateral: Secondary | ICD-10-CM | POA: Diagnosis not present

## 2018-08-23 DIAGNOSIS — H35372 Puckering of macula, left eye: Secondary | ICD-10-CM | POA: Diagnosis not present

## 2018-08-23 DIAGNOSIS — H2513 Age-related nuclear cataract, bilateral: Secondary | ICD-10-CM | POA: Diagnosis not present

## 2018-09-26 IMAGING — DX DG CERVICAL SPINE COMPLETE 4+V
7 series · 7 of 7 positions shown · non-contrast
Comparison: December 06, 2014

CLINICAL DATA: Pain following fall 2 days prior

EXAM:
CERVICAL SPINE - COMPLETE 4+ VIEW

[c-spine lat]
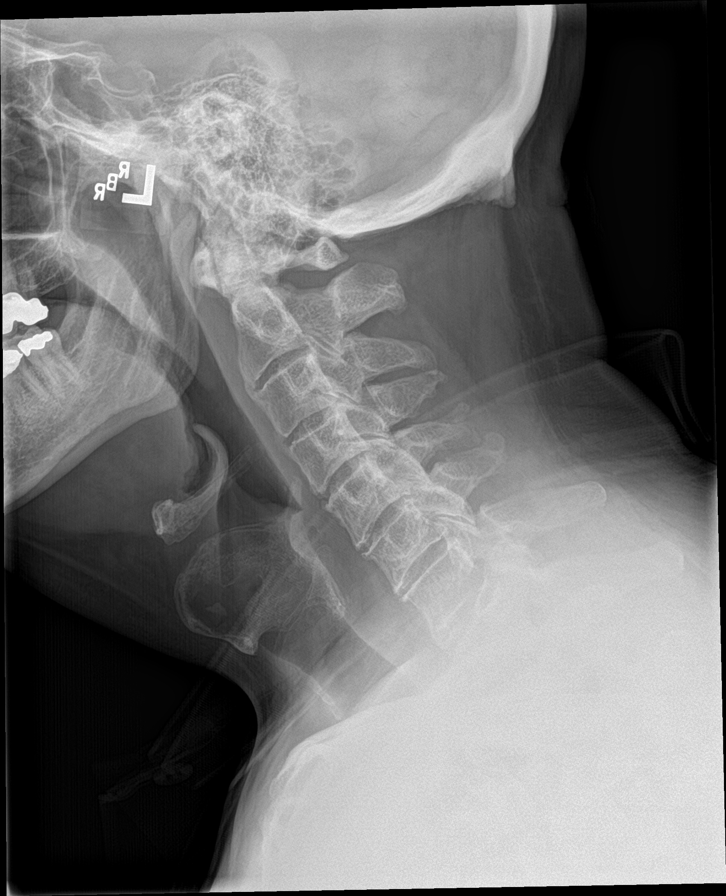

[c-spine obl (1 of 2)]
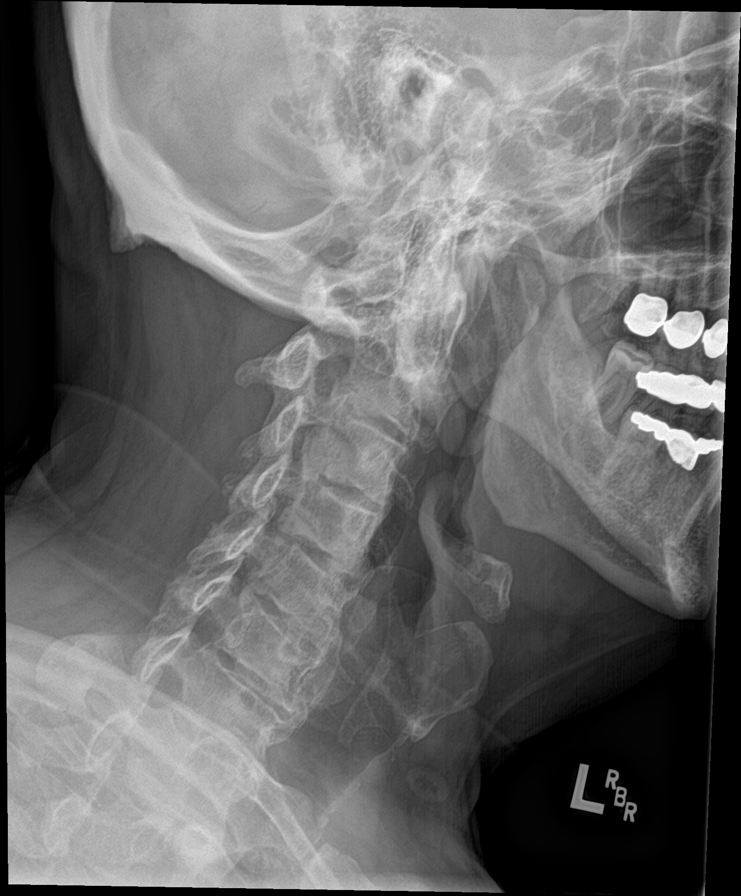

[c-spine obl (2 of 2)]
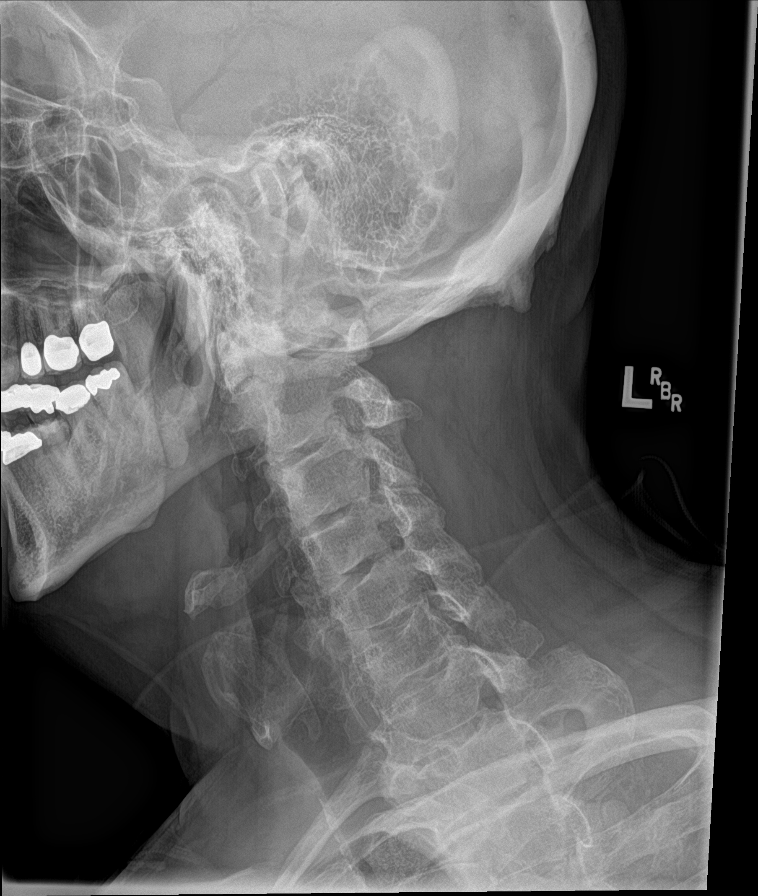

[c-spine ap]
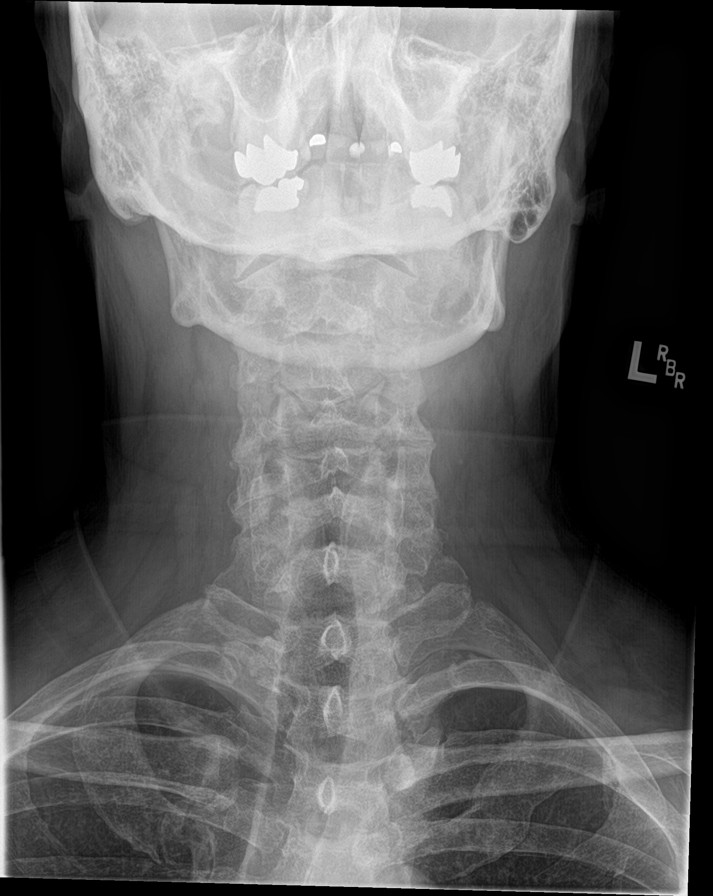

[c-spine open mouth]
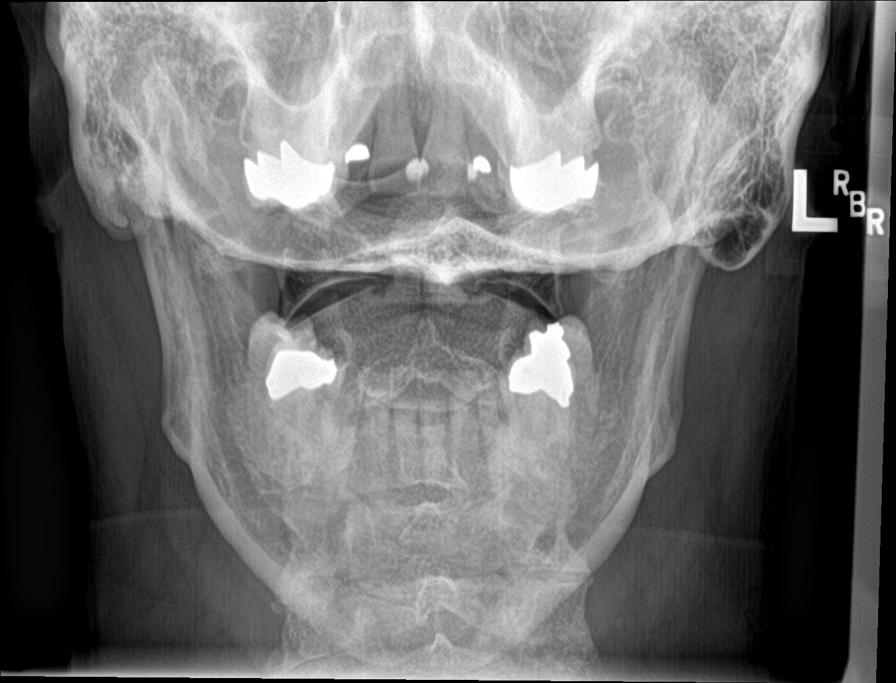

[c-spine swimmers]
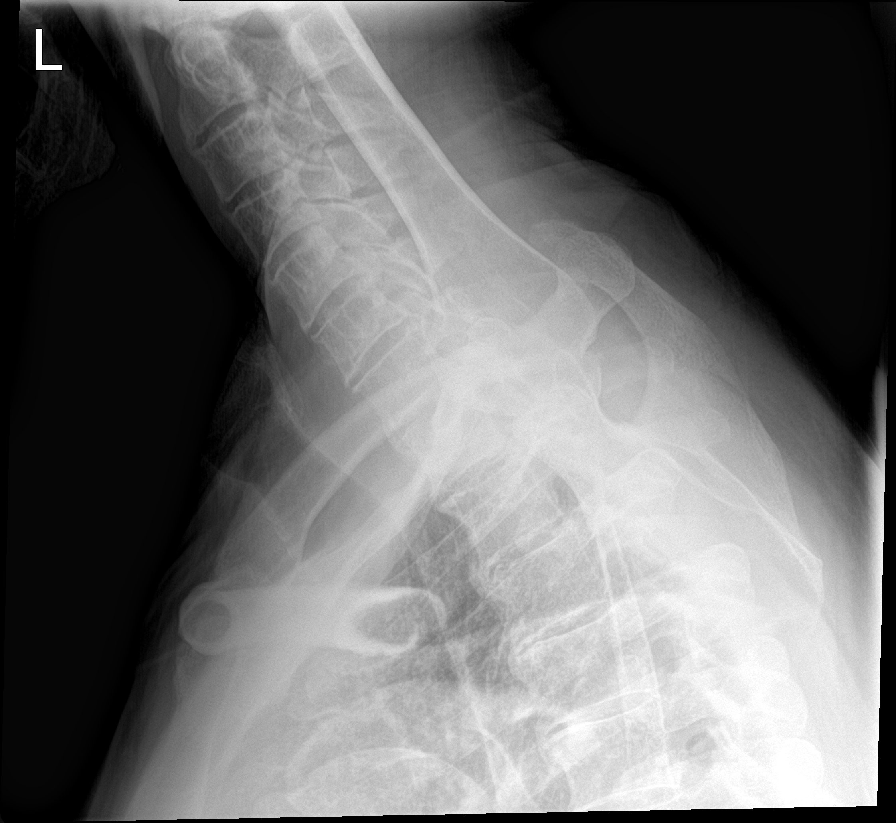

[[person_name]]
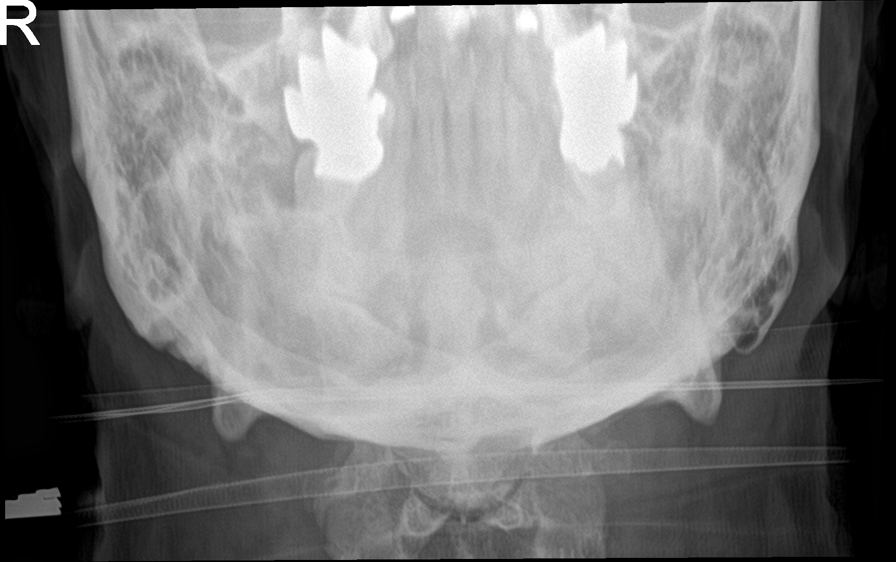

[7 of 7 positions shown; findings below may reference images not displayed]

FINDINGS: Frontal, lateral, open-mouth odontoid, and bilateral oblique views
were obtained. There is no fracture or spondylolisthesis.
Prevertebral soft tissues and predental space regions are normal.
There is moderate disc space narrowing at C2-3, C5-6, and C6-7.
Milder disc space narrowing is noted at other levels. There is facet
hypertrophy with exit foraminal narrowing at C3-4 bilaterally, C4-5
bilaterally, C5-6 bilaterally, and C6-7 on the left. Lung apices are
clear.
IMPRESSION: Multilevel osteoarthritic change.  No fracture or spondylolisthesis.

## 2018-10-13 DIAGNOSIS — M5030 Other cervical disc degeneration, unspecified cervical region: Secondary | ICD-10-CM | POA: Diagnosis not present

## 2018-10-13 DIAGNOSIS — H209 Unspecified iridocyclitis: Secondary | ICD-10-CM | POA: Diagnosis not present

## 2018-10-13 DIAGNOSIS — M450 Ankylosing spondylitis of multiple sites in spine: Secondary | ICD-10-CM | POA: Diagnosis not present

## 2018-12-28 ENCOUNTER — Telehealth: Payer: Self-pay | Admitting: Internal Medicine

## 2018-12-29 NOTE — Telephone Encounter (Signed)
Sent!

## 2018-12-29 NOTE — Telephone Encounter (Signed)
Pt is requesting refill on Ambien.   Last OV: 06/22/2018 Last Fill: 06/30/2018 #30 and 5RF UDS: None

## 2019-01-03 DIAGNOSIS — H44113 Panuveitis, bilateral: Secondary | ICD-10-CM | POA: Diagnosis not present

## 2019-01-03 DIAGNOSIS — H35372 Puckering of macula, left eye: Secondary | ICD-10-CM | POA: Diagnosis not present

## 2019-01-03 DIAGNOSIS — M459 Ankylosing spondylitis of unspecified sites in spine: Secondary | ICD-10-CM | POA: Diagnosis not present

## 2019-01-03 DIAGNOSIS — H2513 Age-related nuclear cataract, bilateral: Secondary | ICD-10-CM | POA: Diagnosis not present

## 2019-01-25 DIAGNOSIS — M450 Ankylosing spondylitis of multiple sites in spine: Secondary | ICD-10-CM | POA: Diagnosis not present

## 2019-01-25 DIAGNOSIS — H209 Unspecified iridocyclitis: Secondary | ICD-10-CM | POA: Diagnosis not present

## 2019-01-25 DIAGNOSIS — M5030 Other cervical disc degeneration, unspecified cervical region: Secondary | ICD-10-CM | POA: Diagnosis not present

## 2019-01-26 DIAGNOSIS — M450 Ankylosing spondylitis of multiple sites in spine: Secondary | ICD-10-CM | POA: Diagnosis not present

## 2019-02-24 ENCOUNTER — Ambulatory Visit: Payer: Self-pay | Admitting: Internal Medicine

## 2019-02-24 NOTE — Telephone Encounter (Signed)
Requesting to be tested for COVID-19 antibodies.   I let him know we are not testing for antibodies through [facility] at this time.  I offered to put him through to Dr. Ethel Rana office but he is going to call [facility] and see what he can find out.

## 2019-03-08 DIAGNOSIS — Z20828 Contact with and (suspected) exposure to other viral communicable diseases: Secondary | ICD-10-CM | POA: Diagnosis not present

## 2019-03-08 DIAGNOSIS — U071 COVID-19: Secondary | ICD-10-CM | POA: Diagnosis not present

## 2019-03-23 DIAGNOSIS — D2261 Melanocytic nevi of right upper limb, including shoulder: Secondary | ICD-10-CM | POA: Diagnosis not present

## 2019-03-23 DIAGNOSIS — L821 Other seborrheic keratosis: Secondary | ICD-10-CM | POA: Diagnosis not present

## 2019-03-23 DIAGNOSIS — L718 Other rosacea: Secondary | ICD-10-CM | POA: Diagnosis not present

## 2019-03-23 DIAGNOSIS — D225 Melanocytic nevi of trunk: Secondary | ICD-10-CM | POA: Diagnosis not present

## 2019-03-23 DIAGNOSIS — D2262 Melanocytic nevi of left upper limb, including shoulder: Secondary | ICD-10-CM | POA: Diagnosis not present

## 2019-04-13 DIAGNOSIS — H2513 Age-related nuclear cataract, bilateral: Secondary | ICD-10-CM | POA: Diagnosis not present

## 2019-04-27 DIAGNOSIS — M5030 Other cervical disc degeneration, unspecified cervical region: Secondary | ICD-10-CM | POA: Diagnosis not present

## 2019-04-27 DIAGNOSIS — H209 Unspecified iridocyclitis: Secondary | ICD-10-CM | POA: Diagnosis not present

## 2019-04-27 DIAGNOSIS — M450 Ankylosing spondylitis of multiple sites in spine: Secondary | ICD-10-CM | POA: Diagnosis not present

## 2019-04-27 LAB — POCT ERYTHROCYTE SEDIMENTATION RATE, NON-AUTOMATED: Sed Rate: 15

## 2019-05-09 DIAGNOSIS — M459 Ankylosing spondylitis of unspecified sites in spine: Secondary | ICD-10-CM | POA: Diagnosis not present

## 2019-05-09 DIAGNOSIS — H2513 Age-related nuclear cataract, bilateral: Secondary | ICD-10-CM | POA: Diagnosis not present

## 2019-05-09 DIAGNOSIS — H35372 Puckering of macula, left eye: Secondary | ICD-10-CM | POA: Diagnosis not present

## 2019-05-09 DIAGNOSIS — H44113 Panuveitis, bilateral: Secondary | ICD-10-CM | POA: Diagnosis not present

## 2019-05-29 ENCOUNTER — Encounter: Payer: Self-pay | Admitting: Internal Medicine

## 2019-05-29 ENCOUNTER — Other Ambulatory Visit: Payer: Self-pay

## 2019-05-29 ENCOUNTER — Ambulatory Visit (INDEPENDENT_AMBULATORY_CARE_PROVIDER_SITE_OTHER): Payer: BC Managed Care – PPO | Admitting: Internal Medicine

## 2019-05-29 DIAGNOSIS — Z20822 Contact with and (suspected) exposure to covid-19: Secondary | ICD-10-CM

## 2019-05-29 DIAGNOSIS — Z20828 Contact with and (suspected) exposure to other viral communicable diseases: Secondary | ICD-10-CM | POA: Diagnosis not present

## 2019-05-29 DIAGNOSIS — R6889 Other general symptoms and signs: Secondary | ICD-10-CM | POA: Diagnosis not present

## 2019-05-29 NOTE — Progress Notes (Signed)
Subjective:    Patient ID: Steven Lara, male    DOB: June 18, 1956, 63 y.o.   MRN: [id number]  DOS:  05/29/2019 Type of visit - description: Virtual Visit via Video Note  I connected with@   by a video enabled telemedicine application and verified that I am speaking with the correct person using two identifiers.   THIS ENCOUNTER IS A VIRTUAL VISIT DUE TO COVID-19 - PATIENT WAS NOT SEEN IN THE OFFICE. PATIENT HAS CONSENTED TO VIRTUAL VISIT / TELEMEDICINE VISIT   Location of patient: home  Location of provider: office  I discussed the limitations of evaluation and management by telemedicine and the availability of in person appointments. The patient expressed understanding and agreed to proceed.  History of Present Illness:  Acute The patient had lunch with his son 05/20/2019 and they spend together approximately 2 hours. On 8/19, his son started to feel unwell. On 8/21, he got a  COVID-19 testing and it came back positive.  Steven Lara is concerned about exposure but he feels well.    Review of Systems  Denies fever chills No chest pain no difficulty breathing No nausea, vomiting, diarrhea No myalgias  Past Medical History:  Diagnosis Date  . Allergy    seasonal  . Arthritis   . Bronchitis, acute 07/05/2015  . Colon polyps    02/07/2007 Next Due: 02/2012 Results: Adenomatous Polyp (Dr Henrene Pastor)   . Diverticulosis   . GERD (gastroesophageal reflux disease) 07/14/2013  . Insomnia   . Mole (skin)    sees derm  . Reiter's syndrome ([place])    dx remotely and again 12/2008 (L eye iritys R foot problems)    Past Surgical History:  Procedure Laterality Date  . COLONOSCOPY W/ POLYPECTOMY      Social History   Socioeconomic History  . Marital status: Widowed    Spouse name: Not on file  . Number of children: 2  . Years of education: Not on file  . Highest education level: Not on file  Occupational History  . Occupation: Glass blower/designer   Social Needs  . Financial resource  strain: Not on file  . Food insecurity    Worry: Not on file    Inability: Not on file  . Transportation needs    Medical: Not on file    Non-medical: Not on file  Tobacco Use  . Smoking status: Never Smoker  . Smokeless tobacco: Never Used  Substance and Sexual Activity  . Alcohol use: Yes    Comment: socially  . Drug use: No  . Sexual activity: Not on file  Lifestyle  . Physical activity    Days per week: Not on file    Minutes per session: Not on file  . Stress: Not on file  Relationships  . Social Herbalist on phone: Not on file    Gets together: Not on file    Attends religious service: Not on file    Active member of club or organization: Not on file    Attends meetings of clubs or organizations: Not on file    Relationship status: Not on file  . Intimate partner violence    Fear of current or ex partner: Not on file    Emotionally abused: Not on file    Physically abused: Not on file    Forced sexual activity: Not on file  Other Topics Concern  . Not on file  Social History Narrative   Married , wife dx w/  rectal cancer 2013-- going to [facility]) ; lost wife ~ 03-2014   Children 6 boy -62  girl        Allergies as of 05/29/2019   No Known Allergies     Medication List       Accurate as of May 29, 2019 11:59 PM. If you have any questions, ask your nurse or doctor.        azaTHIOprine 50 MG tablet Commonly known as: IMURAN Take 100 mg by mouth daily.   fluticasone 50 MCG/ACT nasal spray Commonly known as: FLONASE Place 1 spray into both nostrils daily as needed for allergies or rhinitis.   folic acid 1 MG tablet Commonly known as: FOLVITE Take 1 mg by mouth daily.   Humira 40 MG/0.4ML Pskt Generic drug: Adalimumab Inject into the skin.   methotrexate 2.5 MG tablet Commonly known as: RHEUMATREX Caution:Chemotherapy. Protect from light.4 tabs by mouth two days a week.   PRED FORTE OP Apply to eye as needed.    zolpidem 10 MG tablet Commonly known as: AMBIEN TAKE 1 TABLET(10 MG) BY MOUTH AT BEDTIME AS NEEDED FOR SLEEP   ZyrTEC Allergy 10 MG tablet Generic drug: cetirizine Take 10 mg by mouth daily.           Objective:   Physical Exam There were no vitals taken for this visit. This is a virtual video visit, the video had no audio so we quickly had to convert it to a phone call.  He looked and sounded well.    Assessment     Assessment Insomnia Reiter's syndrome: dx remotely and again 2010 ( left eye iritis, foot pain), Dr Charlestine Night , sees opht GERD Seasonal allergies  Moles: Sees dermatology  PLAN COVID-19 exposure Steven Lara is 63 years old, due to Reiter syndromes he uses Humira every 2 weeks and is seen low-dose of methotrexate . He is high risk for ovarian complications. Plan: Check for COVID-19, recommend patient to contact Dr. Amil Amen: Hold Humira for a couple of weeks?Marland Kitchen Monitor temperature twice a day, watch closely for symptoms such as fever, chills, cough, chest pain, shortness of breath.  If symptoms are mild he will call me, if symptoms are abrupt or severe: ER. He verbalized understanding.      I discussed the assessment and treatment plan with the patient. The patient was provided an opportunity to ask questions and all were answered. The patient agreed with the plan and demonstrated an understanding of the instructions.   The patient was advised to call back or seek an in-person evaluation if the symptoms worsen or if the condition fails to improve as anticipated.

## 2019-05-30 LAB — NOVEL CORONAVIRUS, NAA: SARS-CoV-2, NAA: NOT DETECTED

## 2019-05-30 NOTE — Assessment & Plan Note (Signed)
COVID-19 exposure Steven Lara is 63 years old, due to Reiter syndromes he uses Humira every 2 weeks and is seen low-dose of methotrexate . He is high risk for ovarian complications. Plan: Check for COVID-19, recommend patient to contact Dr. Amil Amen: Hold Humira for a couple of weeks?Marland Kitchen Monitor temperature twice a day, watch closely for symptoms such as fever, chills, cough, chest pain, shortness of breath.  If symptoms are mild he will call me, if symptoms are abrupt or severe: ER. He verbalized understanding.

## 2019-06-25 ENCOUNTER — Other Ambulatory Visit: Payer: Self-pay | Admitting: Internal Medicine

## 2019-06-26 ENCOUNTER — Encounter: Payer: Self-pay | Admitting: Internal Medicine

## 2019-06-26 ENCOUNTER — Ambulatory Visit (INDEPENDENT_AMBULATORY_CARE_PROVIDER_SITE_OTHER): Payer: BC Managed Care – PPO | Admitting: Internal Medicine

## 2019-06-26 ENCOUNTER — Other Ambulatory Visit: Payer: Self-pay

## 2019-06-26 VITALS — BP 121/78 | HR 59 | Temp 97.4°F | Resp 16 | Ht 69.0 in | Wt 226.1 lb

## 2019-06-26 DIAGNOSIS — Z0000 Encounter for general adult medical examination without abnormal findings: Secondary | ICD-10-CM

## 2019-06-26 DIAGNOSIS — G4700 Insomnia, unspecified: Secondary | ICD-10-CM | POA: Diagnosis not present

## 2019-06-26 DIAGNOSIS — Z23 Encounter for immunization: Secondary | ICD-10-CM | POA: Diagnosis not present

## 2019-06-26 DIAGNOSIS — Z125 Encounter for screening for malignant neoplasm of prostate: Secondary | ICD-10-CM | POA: Diagnosis not present

## 2019-06-26 LAB — COMPREHENSIVE METABOLIC PANEL
ALT: 14 U/L (ref 0–53)
AST: 18 U/L (ref 0–37)
Albumin: 4 g/dL (ref 3.5–5.2)
Alkaline Phosphatase: 54 U/L (ref 39–117)
BUN: 17 mg/dL (ref 6–23)
CO2: 30 mEq/L (ref 19–32)
Calcium: 9.2 mg/dL (ref 8.4–10.5)
Chloride: 104 mEq/L (ref 96–112)
Creatinine, Ser: 0.77 mg/dL (ref 0.40–1.50)
GFR: 102.24 mL/min (ref >60.00)
Glucose, Bld: 85 mg/dL (ref 70–99)
Potassium: 4.3 mEq/L (ref 3.5–5.1)
Sodium: 140 mEq/L (ref 135–145)
Total Bilirubin: 1 mg/dL (ref 0.2–1.2)
Total Protein: 6.3 g/dL (ref 6.0–8.3)

## 2019-06-26 LAB — CBC WITH DIFFERENTIAL/PLATELET
Basophils Absolute: 0 10*3/uL (ref 0.0–0.1)
Basophils Relative: 0.6 % (ref 0.0–3.0)
Eosinophils Absolute: 0.1 10*3/uL (ref 0.0–0.7)
Eosinophils Relative: 2.7 % (ref 0.0–5.0)
HCT: 43 % (ref 39.0–52.0)
Hemoglobin: 15 g/dL (ref 13.0–17.0)
Lymphocytes Relative: 28.9 % (ref 12.0–46.0)
Lymphs Abs: 1.2 10*3/uL (ref 0.7–4.0)
MCHC: 34.9 g/dL (ref 30.0–36.0)
MCV: 101.6 fl — ABNORMAL HIGH (ref 78.0–100.0)
Monocytes Absolute: 0.5 10*3/uL (ref 0.1–1.0)
Monocytes Relative: 12.1 % — ABNORMAL HIGH (ref 3.0–12.0)
Neutro Abs: 2.4 10*3/uL (ref 1.4–7.7)
Neutrophils Relative %: 55.7 % (ref 43.0–77.0)
Platelets: 194 10*3/uL (ref 150.0–400.0)
RBC: 4.23 Mil/uL (ref 4.22–5.81)
RDW: 13.8 % (ref 11.5–15.5)
WBC: 4.3 10*3/uL (ref 4.0–10.5)

## 2019-06-26 LAB — LIPID PANEL
Cholesterol: 180 mg/dL (ref 0–200)
HDL: 51 mg/dL (ref >39.00)
LDL Cholesterol: 110 mg/dL — ABNORMAL HIGH (ref 0–99)
NonHDL: 128.81
Total CHOL/HDL Ratio: 4
Triglycerides: 94 mg/dL (ref 0.0–149.0)
VLDL: 18.8 mg/dL (ref 0.0–40.0)

## 2019-06-26 LAB — PSA: PSA: 1.13 ng/mL (ref 0.10–4.00)

## 2019-06-26 NOTE — Progress Notes (Signed)
Subjective:    Patient ID: Steven Lara, male    DOB: 1956-03-31, 63 y.o.   MRN: [id number]  DOS:  06/26/2019 Type of visit - description: CPX General feeling well, he remains very active, doing bicycling regularly  Review of Systems   A 14 point review of systems is negative    Past Medical History:  Diagnosis Date  . Allergy    seasonal  . Arthritis   . Bronchitis, acute 07/05/2015  . Colon polyps    02/07/2007 Next Due: 02/2012 Results: Adenomatous Polyp (Dr Henrene Pastor)   . Diverticulosis   . GERD (gastroesophageal reflux disease) 07/14/2013  . Insomnia   . Mole (skin)    sees derm  . Reiter's syndrome ([place])    dx remotely and again 12/2008 (L eye iritys R foot problems)    Past Surgical History:  Procedure Laterality Date  . COLONOSCOPY W/ POLYPECTOMY      Social History   Socioeconomic History  . Marital status: Widowed    Spouse name: Not on file  . Number of children: 2  . Years of education: Not on file  . Highest education level: Not on file  Occupational History  . Occupation: Glass blower/designer   Social Needs  . Financial resource strain: Not on file  . Food insecurity    Worry: Not on file    Inability: Not on file  . Transportation needs    Medical: Not on file    Non-medical: Not on file  Tobacco Use  . Smoking status: Never Smoker  . Smokeless tobacco: Never Used  Substance and Sexual Activity  . Alcohol use: Yes    Comment: socially  . Drug use: No  . Sexual activity: Not on file  Lifestyle  . Physical activity    Days per week: Not on file    Minutes per session: Not on file  . Stress: Not on file  Relationships  . Social Herbalist on phone: Not on file    Gets together: Not on file    Attends religious service: Not on file    Active member of club or organization: Not on file    Attends meetings of clubs or organizations: Not on file    Relationship status: Not on file  . Intimate partner violence    Fear of current or  ex partner: Not on file    Emotionally abused: Not on file    Physically abused: Not on file    Forced sexual activity: Not on file  Other Topics Concern  . Not on file  Social History Narrative   Married , wife dx w/ rectal cancer 2013-- going to [facility]) ; lost wife ~ 03-2014   Children 19-21 y/o      Family History  Problem Relation Age of Onset  . Dementia Father   . Prostate cancer Father        question of   . Arthritis Brother   . COPD Mother   . Heart attack Neg Hx   . Diabetes Neg Hx   . Colon cancer Neg Hx      Allergies as of 06/26/2019   No Known Allergies     Medication List       Accurate as of June 26, 2019  8:26 PM. If you have any questions, ask your nurse or doctor.        azaTHIOprine 50 MG tablet Commonly known as: IMURAN Take 100 mg  by mouth daily.   fluticasone 50 MCG/ACT nasal spray Commonly known as: FLONASE Place 1 spray into both nostrils daily as needed for allergies or rhinitis.   folic acid 1 MG tablet Commonly known as: FOLVITE Take 1 mg by mouth daily.   Humira 40 MG/0.4ML Pskt Generic drug: Adalimumab Inject into the skin.   methotrexate 2.5 MG tablet Commonly known as: RHEUMATREX Caution:Chemotherapy. Protect from light.2 tabs by mouth two days a week.   PRED FORTE OP Apply to eye as needed.   zolpidem 10 MG tablet Commonly known as: AMBIEN TAKE 1 TABLET(10 MG) BY MOUTH AT BEDTIME AS NEEDED FOR SLEEP   ZyrTEC Allergy 10 MG tablet Generic drug: cetirizine Take 10 mg by mouth daily.           Objective:   Physical Exam BP 121/78 (BP Location: Left Arm, Patient Position: Sitting, Cuff Size: Normal)   Pulse (!) 59   Temp (!) 97.4 F (36.3 C) (Temporal)   Resp 16   Ht 5\' 9"  (1.753 m)   Wt 226 lb 2 oz (102.6 kg)   SpO2 100%   BMI 33.39 kg/m  General: Well developed, NAD, BMI noted Neck: No  thyromegaly  HEENT:  Normocephalic . Face symmetric, atraumatic Lungs:  CTA B Normal respiratory  effort, no intercostal retractions, no accessory muscle use. Heart: RRR,  no murmur.  No pretibial edema bilaterally  Abdomen:  Not distended, soft, non-tender. No rebound or rigidity.   Skin: Exposed areas without rash. Not pale. Not jaundice DRE: Normal sphincter tone, brown stools, prostate is not enlarged, not nodular or tender. Neurologic:  alert & oriented X3.  Speech normal, gait appropriate for age and unassisted Strength symmetric and appropriate for age.  Psych: Cognition and judgment appear intact.  Cooperative with normal attention span and concentration.  Behavior appropriate. No anxious or depressed appearing.     Assessment     Assessment Insomnia Reiter's syndrome: dx remotely and again 2010 ( left eye iritis, foot pain), Dr Charlestine Night , sees opht GERD Seasonal allergies  Moles: Sees dermatology  PLAN Here for a physical Insomnia: Continue Ambien Reiter's  syndrome: Immunosuppressed, trying to wean off methotrexate.  Follow-up elsewhere RTC -2- 3 months for second Shingrix -CPX in 1 year

## 2019-06-26 NOTE — Progress Notes (Signed)
Pre visit review using our clinic review tool, if applicable. No additional management support is needed unless otherwise documented below in the visit note. 

## 2019-06-26 NOTE — Patient Instructions (Addendum)
GO TO THE LAB : Get the blood work     GO TO THE FRONT DESK Schedule your next appointment the second round of Shingrix 2  to 3 months from today  Schedule a physical exam in 1 year from now

## 2019-06-26 NOTE — Telephone Encounter (Signed)
Ambien refill.   Last OV: 05/29/2019, appt later today (06/26/2019) Last Fill: 12/29/2018 #30 and 5RF UDS: Ambien only

## 2019-06-26 NOTE — Assessment & Plan Note (Signed)
-  adacel 03-16-2017   - PNM shot 2011 - prevnar-- 2016 - shingrex discussed , #1 today, next 2-3 months -  Flu shot today - CCS: Colonoscopy (Dr Henrene Pastor)  02/07/2007, Cscope 10-2016, 10 years per GI letter   -Prostate cancer screening: DRE wnl today check a PSA -Labs: CMP, FLP, CBC, PSA -Exercise: doing well,  Road bikes regulalrly

## 2019-06-26 NOTE — Telephone Encounter (Signed)
sent 

## 2019-06-26 NOTE — Assessment & Plan Note (Addendum)
Here for a physical Insomnia: Continue Ambien Reiter's  syndrome: Immunosuppressed, trying to wean off methotrexate.  Follow-up elsewhere RTC -2- 3 months for second Shingrix -CPX in 1 year

## 2019-07-27 DIAGNOSIS — Z79899 Other long term (current) drug therapy: Secondary | ICD-10-CM | POA: Diagnosis not present

## 2019-07-27 DIAGNOSIS — H209 Unspecified iridocyclitis: Secondary | ICD-10-CM | POA: Diagnosis not present

## 2019-07-27 DIAGNOSIS — M5030 Other cervical disc degeneration, unspecified cervical region: Secondary | ICD-10-CM | POA: Diagnosis not present

## 2019-07-27 DIAGNOSIS — M450 Ankylosing spondylitis of multiple sites in spine: Secondary | ICD-10-CM | POA: Diagnosis not present

## 2019-10-10 ENCOUNTER — Other Ambulatory Visit: Payer: Self-pay

## 2019-10-10 ENCOUNTER — Ambulatory Visit (INDEPENDENT_AMBULATORY_CARE_PROVIDER_SITE_OTHER): Payer: BC Managed Care – PPO

## 2019-10-10 DIAGNOSIS — Z23 Encounter for immunization: Secondary | ICD-10-CM | POA: Diagnosis not present

## 2019-10-10 NOTE — Progress Notes (Signed)
Patient here today for second shingrix vaccine. 0.72mL shingrix given in left deltoid IM. Patient tolerated well.

## 2019-10-30 DIAGNOSIS — Z20828 Contact with and (suspected) exposure to other viral communicable diseases: Secondary | ICD-10-CM | POA: Diagnosis not present

## 2019-10-31 DIAGNOSIS — M450 Ankylosing spondylitis of multiple sites in spine: Secondary | ICD-10-CM | POA: Diagnosis not present

## 2019-10-31 DIAGNOSIS — H209 Unspecified iridocyclitis: Secondary | ICD-10-CM | POA: Diagnosis not present

## 2019-10-31 DIAGNOSIS — M5030 Other cervical disc degeneration, unspecified cervical region: Secondary | ICD-10-CM | POA: Diagnosis not present

## 2019-10-31 DIAGNOSIS — Z79899 Other long term (current) drug therapy: Secondary | ICD-10-CM | POA: Diagnosis not present

## 2019-11-02 DIAGNOSIS — M450 Ankylosing spondylitis of multiple sites in spine: Secondary | ICD-10-CM | POA: Diagnosis not present

## 2019-11-28 DIAGNOSIS — H44113 Panuveitis, bilateral: Secondary | ICD-10-CM | POA: Diagnosis not present

## 2019-11-28 DIAGNOSIS — H35372 Puckering of macula, left eye: Secondary | ICD-10-CM | POA: Diagnosis not present

## 2019-11-28 DIAGNOSIS — H2513 Age-related nuclear cataract, bilateral: Secondary | ICD-10-CM | POA: Diagnosis not present

## 2019-11-29 DIAGNOSIS — Z20828 Contact with and (suspected) exposure to other viral communicable diseases: Secondary | ICD-10-CM | POA: Diagnosis not present

## 2019-12-05 DIAGNOSIS — L403 Pustulosis palmaris et plantaris: Secondary | ICD-10-CM | POA: Diagnosis not present

## 2019-12-18 DIAGNOSIS — Z79899 Other long term (current) drug therapy: Secondary | ICD-10-CM | POA: Diagnosis not present

## 2019-12-18 DIAGNOSIS — M450 Ankylosing spondylitis of multiple sites in spine: Secondary | ICD-10-CM | POA: Diagnosis not present

## 2019-12-18 DIAGNOSIS — H209 Unspecified iridocyclitis: Secondary | ICD-10-CM | POA: Diagnosis not present

## 2019-12-18 DIAGNOSIS — M5030 Other cervical disc degeneration, unspecified cervical region: Secondary | ICD-10-CM | POA: Diagnosis not present

## 2019-12-20 ENCOUNTER — Telehealth: Payer: Self-pay | Admitting: Internal Medicine

## 2019-12-21 NOTE — Telephone Encounter (Signed)
PDMP okay, RF sent 

## 2019-12-21 NOTE — Telephone Encounter (Signed)
Ambien refill.   Last OV: 06/26/2019 Last Fill: 06/26/2019 #30 and 5RF Pt sig:1 tab qhs prn UDS: Ambien only

## 2020-01-08 DIAGNOSIS — L403 Pustulosis palmaris et plantaris: Secondary | ICD-10-CM | POA: Diagnosis not present

## 2020-01-30 DIAGNOSIS — Z79899 Other long term (current) drug therapy: Secondary | ICD-10-CM | POA: Diagnosis not present

## 2020-01-30 DIAGNOSIS — H209 Unspecified iridocyclitis: Secondary | ICD-10-CM | POA: Diagnosis not present

## 2020-01-30 DIAGNOSIS — M450 Ankylosing spondylitis of multiple sites in spine: Secondary | ICD-10-CM | POA: Diagnosis not present

## 2020-01-30 DIAGNOSIS — M5030 Other cervical disc degeneration, unspecified cervical region: Secondary | ICD-10-CM | POA: Diagnosis not present

## 2020-04-02 DIAGNOSIS — D229 Melanocytic nevi, unspecified: Secondary | ICD-10-CM | POA: Diagnosis not present

## 2020-04-02 DIAGNOSIS — L400 Psoriasis vulgaris: Secondary | ICD-10-CM | POA: Diagnosis not present

## 2020-04-02 DIAGNOSIS — L570 Actinic keratosis: Secondary | ICD-10-CM | POA: Diagnosis not present

## 2020-04-02 DIAGNOSIS — L821 Other seborrheic keratosis: Secondary | ICD-10-CM | POA: Diagnosis not present

## 2020-04-02 DIAGNOSIS — L403 Pustulosis palmaris et plantaris: Secondary | ICD-10-CM | POA: Diagnosis not present

## 2020-05-01 DIAGNOSIS — Z79899 Other long term (current) drug therapy: Secondary | ICD-10-CM | POA: Diagnosis not present

## 2020-05-01 DIAGNOSIS — M5030 Other cervical disc degeneration, unspecified cervical region: Secondary | ICD-10-CM | POA: Diagnosis not present

## 2020-05-01 DIAGNOSIS — M450 Ankylosing spondylitis of multiple sites in spine: Secondary | ICD-10-CM | POA: Diagnosis not present

## 2020-05-01 DIAGNOSIS — H209 Unspecified iridocyclitis: Secondary | ICD-10-CM | POA: Diagnosis not present

## 2020-05-01 LAB — BASIC METABOLIC PANEL
BUN: 17 (ref 4–21)
CO2: 24 — AB (ref 13–22)
Chloride: 105 (ref 99–108)
Creatinine: 1 (ref 0.6–1.3)
Glucose: 100
Potassium: 4.9 (ref 3.4–5.3)
Sodium: 142 (ref 137–147)

## 2020-05-01 LAB — HEPATIC FUNCTION PANEL
ALT: 25 (ref 10–40)
AST: 24 (ref 14–40)
Alkaline Phosphatase: 67 (ref 25–125)
Bilirubin, Total: 0.9

## 2020-05-01 LAB — COMPREHENSIVE METABOLIC PANEL
Albumin: 4.2 (ref 3.5–5.0)
Calcium: 9.4 (ref 8.7–10.7)
GFR calc Af Amer: 96
GFR calc non Af Amer: 83
Globulin: 2.7

## 2020-05-01 LAB — CBC: RBC: 4.78 (ref 3.87–5.11)

## 2020-05-02 LAB — CBC AND DIFFERENTIAL
HCT: 47 (ref 41–53)
Hemoglobin: 16 (ref 13.5–17.5)
Neutrophils Absolute: 3
Platelets: 243 (ref 150–399)
WBC: 6

## 2020-05-21 DIAGNOSIS — H44113 Panuveitis, bilateral: Secondary | ICD-10-CM | POA: Diagnosis not present

## 2020-05-21 DIAGNOSIS — H35372 Puckering of macula, left eye: Secondary | ICD-10-CM | POA: Diagnosis not present

## 2020-05-21 DIAGNOSIS — H2513 Age-related nuclear cataract, bilateral: Secondary | ICD-10-CM | POA: Diagnosis not present

## 2020-06-18 ENCOUNTER — Telehealth: Payer: Self-pay | Admitting: Internal Medicine

## 2020-06-19 NOTE — Telephone Encounter (Signed)
PDMP okay, Rx sent, has appointment with me later this month

## 2020-06-19 NOTE — Telephone Encounter (Signed)
Ambien refill.   Last OV: 06/26/2019 Last Fill: 12/21/2019 #30 and 5RF Pt sig: 1 tab qhs prn UDS: Ambien only

## 2020-06-26 ENCOUNTER — Ambulatory Visit (INDEPENDENT_AMBULATORY_CARE_PROVIDER_SITE_OTHER): Payer: BC Managed Care – PPO | Admitting: Internal Medicine

## 2020-06-26 ENCOUNTER — Other Ambulatory Visit: Payer: Self-pay

## 2020-06-26 ENCOUNTER — Encounter: Payer: Self-pay | Admitting: Internal Medicine

## 2020-06-26 VITALS — BP 102/65 | HR 62 | Temp 97.8°F | Resp 16 | Ht 69.0 in | Wt 228.4 lb

## 2020-06-26 DIAGNOSIS — Z0000 Encounter for general adult medical examination without abnormal findings: Secondary | ICD-10-CM | POA: Diagnosis not present

## 2020-06-26 DIAGNOSIS — E785 Hyperlipidemia, unspecified: Secondary | ICD-10-CM | POA: Diagnosis not present

## 2020-06-26 MED ORDER — ZOLPIDEM TARTRATE 10 MG PO TABS: 10.0000 mg | ORAL_TABLET | Freq: Every evening | ORAL | 5 refills | Status: DC | PRN

## 2020-06-26 NOTE — Progress Notes (Signed)
Subjective:    Patient ID: Steven Lara, male    DOB: [birth year], 64 y.o.   MRN: [id number]  DOS:  06/26/2020 Type of visit - description: CPX Since the last office visit he is doing well.  Wt Readings from Last 3 Encounters:  06/26/20 228 lb 6 oz (103.6 kg)  06/26/19 226 lb 2 oz (102.6 kg)  06/22/18 226 lb 6 oz (102.7 kg)     Review of Systems  A 14 point review of systems is negative     Past Medical History:  Diagnosis Date  . Allergy    seasonal  . Arthritis   . Colon polyps    02/07/2007 Next Due: 02/2012 Results: Adenomatous Polyp (Dr Henrene Pastor)   . Diverticulosis   . GERD (gastroesophageal reflux disease) 07/14/2013  . Insomnia   . Mole (skin)    sees derm  . Reiter's syndrome ([place])    dx remotely and again 12/2008 (L eye iritys R foot problems)    Past Surgical History:  Procedure Laterality Date  . COLONOSCOPY W/ POLYPECTOMY      Allergies as of 06/26/2020   No Known Allergies     Medication List       Accurate as of June 26, 2020 11:59 PM. If you have any questions, ask your nurse or doctor.        STOP taking these medications   azaTHIOprine 50 MG tablet Commonly known as: IMURAN Stopped by: Kathlene November, MD     TAKE these medications   fluticasone 50 MCG/ACT nasal spray Commonly known as: FLONASE Place 1 spray into both nostrils daily as needed for allergies or rhinitis.   folic acid 1 MG tablet Commonly known as: FOLVITE Take 1 mg by mouth daily.   Humira 40 MG/0.4ML Pskt Generic drug: Adalimumab Inject into the skin.   methotrexate 2.5 MG tablet Commonly known as: RHEUMATREX Caution:Chemotherapy. Protect from light. USE AS DIRECTED   PRED FORTE OP Apply to eye as needed.   zolpidem 10 MG tablet Commonly known as: AMBIEN Take 1 tablet (10 mg total) by mouth at bedtime as needed for sleep.   ZyrTEC Allergy 10 MG tablet Generic drug: cetirizine Take 10 mg by mouth daily.          Objective:   Physical Exam BP 102/65  (BP Location: Left Arm, Patient Position: Sitting, Cuff Size: Normal)   Pulse 62   Temp 97.8 F (36.6 C) (Oral)   Resp 16   Ht 5\' 9"  (1.753 m)   Wt 228 lb 6 oz (103.6 kg)   SpO2 97%   BMI 33.73 kg/m  General: Well developed, NAD, BMI noted Neck: No  thyromegaly  HEENT:  Normocephalic . Face symmetric, atraumatic Lungs:  CTA B Normal respiratory effort, no intercostal retractions, no accessory muscle use. Heart: RRR,  no murmur.  Abdomen:  Not distended, soft, non-tender. No rebound or rigidity.   Lower extremities: no pretibial edema bilaterally  Skin: Exposed areas without rash. Not pale. Not jaundice Neurologic:  alert & oriented X3.  Speech normal, gait appropriate for age and unassisted Strength symmetric and appropriate for age.  Psych: Cognition and judgment appear intact.  Cooperative with normal attention span and concentration.  Behavior appropriate. No anxious or depressed appearing.     Assessment     Assessment Insomnia Reiter's syndrome: dx remotely and again 2010 ( left eye iritis, foot pain), Dr Charlestine Night , sees opht GERD Seasonal allergies  Moles: Sees dermatology  PLAN Here  for CPX Insomnia: RF Ambien.  Well controlled. Reiter's syndrome: Follow-up closely by rheumatology, they do blood work regularly. Had a rash, saw dermatology,?  Psoriasis. RTC 1 year  This visit occurred during the SARS-CoV-2 public health emergency.  Safety protocols were in place, including screening questions prior to the visit, additional usage of staff PPE, and extensive cleaning of exam room while observing appropriate contact time as indicated for disinfecting solutions.

## 2020-06-26 NOTE — Progress Notes (Signed)
Pre visit review using our clinic review tool, if applicable. No additional management support is needed unless otherwise documented below in the visit note. 

## 2020-06-26 NOTE — Patient Instructions (Signed)
   GO TO THE LAB : Get the blood work     GO TO THE FRONT DESK, PLEASE SCHEDULE YOUR APPOINTMENTS Come back for a physical exam in 1 year 

## 2020-06-27 ENCOUNTER — Encounter: Payer: Self-pay | Admitting: Internal Medicine

## 2020-06-27 LAB — LIPID PANEL
Cholesterol: 220 mg/dL — ABNORMAL HIGH (ref <200)
HDL: 56 mg/dL (ref >=40)
LDL Cholesterol (Calc): 136 mg/dL (calc) — ABNORMAL HIGH
Non-HDL Cholesterol (Calc): 164 mg/dL (calc) — ABNORMAL HIGH (ref <130)
Total CHOL/HDL Ratio: 3.9 (calc) (ref <5.0)
Triglycerides: 149 mg/dL (ref <150)

## 2020-06-27 LAB — TSH: TSH: 2.86 mIU/L (ref 0.40–4.50)

## 2020-06-27 NOTE — Assessment & Plan Note (Signed)
-  adacel 03-16-2017   - PNM shot 2011 - prevnar-- 2016 - s/p shingrex discussed - s/p covid vaccines  -  Flu shot - pt  prefers to wait till October - CCS: Colonoscopy (Dr Henrene Pastor)  02/07/2007, [place] 10-2016, 10 years per GI letter   -Prostate cancer screening: DRE PSA wnl 2020 -Labs: Will get copies of labs from rheumatology;check a lipid panel (nonfasting) and TSH -Exercise: doing well, continue biking regularly

## 2020-06-27 NOTE — Assessment & Plan Note (Signed)
Here for CPX Insomnia: RF Ambien.  Well controlled. Reiter's syndrome: Follow-up closely by rheumatology, they do blood work regularly. Had a rash, saw dermatology,?  Psoriasis. RTC 1 year

## 2020-07-10 ENCOUNTER — Other Ambulatory Visit: Payer: BC Managed Care – PPO

## 2020-07-10 ENCOUNTER — Encounter: Payer: Self-pay | Admitting: Internal Medicine

## 2020-07-10 DIAGNOSIS — Z20822 Contact with and (suspected) exposure to covid-19: Secondary | ICD-10-CM

## 2020-07-11 LAB — SARS-COV-2, NAA 2 DAY TAT

## 2020-07-11 LAB — NOVEL CORONAVIRUS, NAA: SARS-CoV-2, NAA: NOT DETECTED

## 2020-07-17 ENCOUNTER — Encounter: Payer: Self-pay | Admitting: Internal Medicine

## 2020-07-17 ENCOUNTER — Telehealth (INDEPENDENT_AMBULATORY_CARE_PROVIDER_SITE_OTHER): Payer: BC Managed Care – PPO | Admitting: Internal Medicine

## 2020-07-17 ENCOUNTER — Other Ambulatory Visit: Payer: Self-pay

## 2020-07-17 VITALS — Ht 69.0 in | Wt 220.0 lb

## 2020-07-17 DIAGNOSIS — J069 Acute upper respiratory infection, unspecified: Secondary | ICD-10-CM | POA: Diagnosis not present

## 2020-07-17 MED ORDER — AZITHROMYCIN 250 MG PO TABS: ORAL_TABLET | ORAL | 0 refills | Status: DC

## 2020-07-17 NOTE — Progress Notes (Signed)
Pre visit review using our clinic review tool, if applicable. No additional management support is needed unless otherwise documented below in the visit note. 

## 2020-07-17 NOTE — Progress Notes (Signed)
Subjective:    Patient ID: Steven Lara, male    DOB: [birth year], 64 y.o.   MRN: [id number]  DOS:  07/17/2020 Type of visit - description: Virtual Visit via Video Note  I connected with the above patient  by a video enabled telemedicine application and verified that I am speaking with the correct person using two identifiers.   THIS ENCOUNTER IS A VIRTUAL VISIT DUE TO COVID-19 - PATIENT WAS NOT SEEN IN THE OFFICE. PATIENT HAS CONSENTED TO VIRTUAL VISIT / TELEMEDICINE VISIT   Location of patient: home  Location of provider: office  Persons participating in the virtual visit: patient, provider   I discussed the limitations of evaluation and management by telemedicine and the availability of in person appointments. The patient expressed understanding and agreed to proceed.  Acute Symptoms started 07/07/2020: Developed a head cold with sinus pain, cough, congestion, blowing nasal discharge.  Then symptoms "went to the chest" with chest congestion and increased cough.  Overall better but has a persistent deep cough with yellow or clear mucus production throughout the day, worse in the morning. Taking OTC cough suppressants.    Review of Systems Denies fever chills No nausea or vomiting Did have some myalgias last week (leg pain) that is resolved. No chest pain or difficulty breathing  Past Medical History:  Diagnosis Date  . Allergy    seasonal  . Arthritis   . Colon polyps    02/07/2007 Next Due: 02/2012 Results: Adenomatous Polyp (Dr Henrene Pastor)   . Diverticulosis   . GERD (gastroesophageal reflux disease) 07/14/2013  . Insomnia   . Mole (skin)    sees derm  . Reiter's syndrome    dx remotely and again 12/2008 (L eye iritys R foot problems)    Past Surgical History:  Procedure Laterality Date  . COLONOSCOPY W/ POLYPECTOMY      Allergies as of 07/17/2020   No Known Allergies     Medication List       Accurate as of July 17, 2020 11:59 PM. If you have any  questions, ask your nurse or doctor.        azithromycin 250 MG tablet Commonly known as: Zithromax Z-Pak 2 tabs a day the first day, then 1 tab a day x 4 days Started by: Kathlene November, MD   fluticasone 50 MCG/ACT nasal spray Commonly known as: FLONASE Place 1 spray into both nostrils daily as needed for allergies or rhinitis.   folic acid 1 MG tablet Commonly known as: FOLVITE Take 1 mg by mouth daily.   Humira 40 MG/0.4ML Pskt Generic drug: Adalimumab Inject into the skin.   methotrexate 2.5 MG tablet Commonly known as: RHEUMATREX Caution:Chemotherapy. Protect from light. USE AS DIRECTED   PRED FORTE OP Apply to eye as needed.   zolpidem 10 MG tablet Commonly known as: AMBIEN Take 1 tablet (10 mg total) by mouth at bedtime as needed for sleep.   ZyrTEC Allergy 10 MG tablet Generic drug: cetirizine Take 10 mg by mouth daily.          Objective:   Physical Exam Ht 5\' 9"  (1.753 m)   Wt 220 lb (99.8 kg)   BMI 32.49 kg/m  This is a virtual video visit, he looks alert oriented x3, in no distress, speaking in complete sentences    Assessment   Assessment Insomnia Reiter's syndrome: dx remotely and again 2010 ( left eye iritis, foot pain), Dr Charlestine Night , sees opht GERD Seasonal allergies  Moles: Sees  dermatology  PLAN  URI, bronchitis: Upper respiratory symptoms started 10 days ago, 8 days ago tested negative for Covid (could have been false negative), he is fully vaccinated, clinically better but still have a persistent "deep cough" with yellow sputum production.  He is immunosuppressed. Plan: In addition to rest, fluids, Tylenol, Robitussin-DM will add Zithromax. Call if not gradually better, call if symptoms increase.  Patient verbalized understanding    I discussed the assessment and treatment plan with the patient. The patient was provided an opportunity to ask questions and all were answered. The patient agreed with the plan and demonstrated an understanding  of the instructions.   The patient was advised to call back or seek an in-person evaluation if the symptoms worsen or if the condition fails to improve as anticipated.

## 2020-07-18 NOTE — Assessment & Plan Note (Signed)
Steven Lara, bronchitis: Upper respiratory symptoms started 10 days ago, 8 days ago tested negative for Covid (could have been false negative), he is fully vaccinated, clinically better but still have a persistent "deep cough" with yellow sputum production.  He is immunosuppressed. Plan: In addition to rest, fluids, Tylenol, Robitussin-DM will add Zithromax. Call if not gradually better, call if symptoms increase.  Patient verbalized understanding

## 2020-08-02 DIAGNOSIS — M450 Ankylosing spondylitis of multiple sites in spine: Secondary | ICD-10-CM | POA: Diagnosis not present

## 2020-08-02 DIAGNOSIS — Z79899 Other long term (current) drug therapy: Secondary | ICD-10-CM | POA: Diagnosis not present

## 2020-08-02 DIAGNOSIS — M5030 Other cervical disc degeneration, unspecified cervical region: Secondary | ICD-10-CM | POA: Diagnosis not present

## 2020-08-02 DIAGNOSIS — H209 Unspecified iridocyclitis: Secondary | ICD-10-CM | POA: Diagnosis not present

## 2020-10-18 ENCOUNTER — Encounter: Payer: Self-pay | Admitting: Internal Medicine

## 2020-10-18 ENCOUNTER — Telehealth (INDEPENDENT_AMBULATORY_CARE_PROVIDER_SITE_OTHER): Payer: BC Managed Care – PPO | Admitting: Internal Medicine

## 2020-10-18 ENCOUNTER — Other Ambulatory Visit: Payer: Self-pay

## 2020-10-18 VITALS — Ht 69.0 in | Wt 220.0 lb

## 2020-10-18 DIAGNOSIS — U071 COVID-19: Secondary | ICD-10-CM

## 2020-10-18 NOTE — Progress Notes (Signed)
Subjective:    Patient ID: Steven Lara, male    DOB: 05-12-1956, 65 y.o.   MRN: [id number]  DOS:  10/18/2020 Type of visit - description: Virtual Visit via Video Note  I connected with the above patient  by a video enabled telemedicine application and verified that I am speaking with the correct person using two identifiers.   THIS ENCOUNTER IS A VIRTUAL VISIT DUE TO COVID-19 - PATIENT WAS NOT SEEN IN THE OFFICE. PATIENT HAS CONSENTED TO VIRTUAL VISIT / TELEMEDICINE VISIT   Location of patient: home  Location of provider: office  Persons participating in the virtual visit: patient, provider   I discussed the limitations of evaluation and management by telemedicine and the availability of in person appointments. The patient expressed understanding and agreed to proceed.  Acute Symptoms a started 10/15/2019: Back pain, leg aches, low-grade fevers, sinus headache, runny nose and sore throat. At the time her daughter was also sick. The patient tested positive for COVID on 10/15/2020.  Her daughter is also positive.  Today, he feels still very fatigue, no further fever, the congestion has gone from the sinuses to the chest and he has some cough.  Earlier today he had some sputum production.  No nausea, vomiting, diarrhea.  Patient is on Humira    Review of Systems See above   Past Medical History:  Diagnosis Date  . Allergy    seasonal  . Arthritis   . Colon polyps    02/07/2007 Next Due: 02/2012 Results: Adenomatous Polyp (Dr Henrene Pastor)   . Diverticulosis   . GERD (gastroesophageal reflux disease) 07/14/2013  . Insomnia   . Mole (skin)    sees derm  . Reiter's syndrome    dx remotely and again 12/2008 (L eye iritys R foot problems)    Past Surgical History:  Procedure Laterality Date  . COLONOSCOPY W/ POLYPECTOMY      Allergies as of 10/18/2020   No Known Allergies     Medication List       Accurate as of October 18, 2020 11:39 AM. If you have any questions, ask  your nurse or doctor.        Adalimumab 40 MG/0.4ML Pskt Inject into the skin.   azithromycin 250 MG tablet Commonly known as: Zithromax Z-Pak 2 tabs a day the first day, then 1 tab a day x 4 days   cetirizine 10 MG tablet Commonly known as: ZYRTEC Take 10 mg by mouth daily.   fluticasone 50 MCG/ACT nasal spray Commonly known as: FLONASE Place 1 spray into both nostrils daily as needed for allergies or rhinitis.   folic acid 1 MG tablet Commonly known as: FOLVITE Take 1 mg by mouth daily.   methotrexate 2.5 MG tablet Commonly known as: RHEUMATREX Caution:Chemotherapy. Protect from light. USE AS DIRECTED   PRED FORTE OP Apply to eye as needed.   zolpidem 10 MG tablet Commonly known as: AMBIEN Take 1 tablet (10 mg total) by mouth at bedtime as needed for sleep.          Objective:   Physical Exam Ht 5\' 9"  (1.753 m)   Wt 220 lb (99.8 kg)   BMI 32.49 kg/m  This is a virtual video visit, no vital signs available, he is alert oriented x3.  No distress.    Assessment      Assessment Insomnia Reiter's syndrome: dx remotely and again 2010 ( left eye iritis, foot pain), Dr Amil Amen , sees opht GERD Seasonal allergies  Moles: Sees dermatology  PLAN COVID-19: S/p 3 covid vaccines and a influenza shot. He developed symptoms 4 days ago, the next day he had a + COVID test at home. He is immunosuppressed with Humira (every 2 weeks), he is weaning off methotrexate.  Has a history of Reiter's  syndrome. Plan: Hold next Humira dose and notify rheumatology Refer him to the [facility] infusion clinic. Otherwise the treatment will be conservative with fluids, Tylenol, Mucinex ER if severe symptoms, chest pain, high fever, difficulty breathing. Patient verbalized understanding    I discussed the assessment and treatment plan with the patient. The patient was provided an opportunity to ask questions and all were answered. The patient agreed with the plan and demonstrated an  understanding of the instructions.   The patient was advised to call back or seek an in-person evaluation if the symptoms worsen or if the condition fails to improve as anticipated.

## 2020-10-18 NOTE — Progress Notes (Signed)
Pre visit review using our clinic review tool, if applicable. No additional management support is needed unless otherwise documented below in the visit note. 

## 2020-10-19 NOTE — Assessment & Plan Note (Signed)
COVID-19: S/p 3 covid vaccines and a influenza shot. He developed symptoms 4 days ago, the next day he had a + COVID test at home. He is immunosuppressed with Humira (every 2 weeks), he is weaning off methotrexate.  Has a history of Reiter's  syndrome. Plan: Hold next Humira dose and notify rheumatology Refer him to the [facility] infusion clinic. Otherwise the treatment will be conservative with fluids, Tylenol, Mucinex ER if severe symptoms, chest pain, high fever, difficulty breathing. Patient verbalized understanding

## 2020-11-06 DIAGNOSIS — M450 Ankylosing spondylitis of multiple sites in spine: Secondary | ICD-10-CM | POA: Diagnosis not present

## 2020-12-03 DIAGNOSIS — H2513 Age-related nuclear cataract, bilateral: Secondary | ICD-10-CM | POA: Diagnosis not present

## 2020-12-03 DIAGNOSIS — H35372 Puckering of macula, left eye: Secondary | ICD-10-CM | POA: Diagnosis not present

## 2020-12-03 DIAGNOSIS — H44113 Panuveitis, bilateral: Secondary | ICD-10-CM | POA: Diagnosis not present

## 2020-12-10 DIAGNOSIS — D485 Neoplasm of uncertain behavior of skin: Secondary | ICD-10-CM | POA: Diagnosis not present

## 2020-12-10 DIAGNOSIS — L820 Inflamed seborrheic keratosis: Secondary | ICD-10-CM | POA: Diagnosis not present

## 2020-12-18 ENCOUNTER — Encounter: Payer: Self-pay | Admitting: Internal Medicine

## 2021-01-15 ENCOUNTER — Telehealth: Payer: Self-pay | Admitting: Internal Medicine

## 2021-01-16 NOTE — Telephone Encounter (Signed)
PDMP okay, Rx sent 

## 2021-01-16 NOTE — Telephone Encounter (Signed)
Requesting: Ambien  Contract: None UDS: N/A (Ambien only) Last Visit: 10/18/2020 Next Visit: None Last Refill: 06/26/2020 #30 and 5RF  Please Advise

## 2021-02-06 DIAGNOSIS — M450 Ankylosing spondylitis of multiple sites in spine: Secondary | ICD-10-CM | POA: Diagnosis not present

## 2021-02-06 DIAGNOSIS — H209 Unspecified iridocyclitis: Secondary | ICD-10-CM | POA: Diagnosis not present

## 2021-02-06 DIAGNOSIS — M5030 Other cervical disc degeneration, unspecified cervical region: Secondary | ICD-10-CM | POA: Diagnosis not present

## 2021-02-06 DIAGNOSIS — Z79899 Other long term (current) drug therapy: Secondary | ICD-10-CM | POA: Diagnosis not present

## 2021-03-11 ENCOUNTER — Encounter: Payer: Self-pay | Admitting: Internal Medicine

## 2021-03-11 ENCOUNTER — Other Ambulatory Visit: Payer: Self-pay

## 2021-03-11 ENCOUNTER — Ambulatory Visit: Payer: BC Managed Care – PPO | Admitting: Internal Medicine

## 2021-03-11 VITALS — BP 138/92 | HR 61 | Temp 98.0°F | Resp 16 | Ht 69.0 in | Wt 230.2 lb

## 2021-03-11 DIAGNOSIS — S239XXA Sprain of unspecified parts of thorax, initial encounter: Secondary | ICD-10-CM

## 2021-03-11 NOTE — Progress Notes (Signed)
Subjective:    Patient ID: Steven Lara, male    DOB: [birth year], 66 y.o.   MRN: [id number]  DOS:  03/11/2021 Type of visit - description: Acute  In the last 2 weeks, he had  to lift a toilet and subsequently had to hold a cradle for 30 minutes with his arms up. The next day,  he developed pain at the thoracic spine and some shoulder achiness along with it. Pain was worse whenever he was driving over a bump or move his torso. There was no radiation anteriorly, no substernal chest pain, the pain was not described as "ripping".  Had a mild exacerbation of chronic neck pain.  No back pain.  No low back pain.  No paresthesias.  Overall the pain is much improved now.   Review of Systems See above   Past Medical History:  Diagnosis Date   Allergy    seasonal   Arthritis    Colon polyps    02/07/2007 Next Due: 02/2012 Results: Adenomatous Polyp (Dr Henrene Pastor)    Diverticulosis    GERD (gastroesophageal reflux disease) 07/14/2013   Insomnia    Mole (skin)    sees derm   Reiter's syndrome    dx remotely and again 12/2008 (L eye iritys R foot problems)    Past Surgical History:  Procedure Laterality Date   COLONOSCOPY W/ POLYPECTOMY      Allergies as of 03/11/2021   No Known Allergies      Medication List        Accurate as of March 11, 2021  3:12 PM. If you have any questions, ask your nurse or doctor.          Adalimumab 40 MG/0.4ML Pskt Inject into the skin.   cetirizine 10 MG tablet Commonly known as: ZYRTEC Take 10 mg by mouth daily.   fluticasone 50 MCG/ACT nasal spray Commonly known as: FLONASE Place 1 spray into both nostrils daily as needed for allergies or rhinitis.   folic acid 1 MG tablet Commonly known as: FOLVITE Take 1 mg by mouth daily.   methotrexate 2.5 MG tablet Commonly known as: RHEUMATREX Caution:Chemotherapy. Protect from light. USE AS DIRECTED   PRED FORTE OP Apply to eye as needed.   zolpidem 10 MG tablet Commonly known as:  AMBIEN TAKE 1 TABLET(10 MG) BY MOUTH AT BEDTIME AS NEEDED FOR SLEEP           Objective:   Physical Exam BP (!) 138/92 (BP Location: Left Arm, Patient Position: Sitting, Cuff Size: Small)   Pulse 61   Temp 98 F (36.7 C) (Oral)   Resp 16   Ht 5\' 9"  (1.753 m)   Wt 230 lb 4 oz (104.4 kg)   SpO2 97%   BMI 34.00 kg/m  General:   Well developed, NAD, BMI noted. HEENT:  Normocephalic . Face symmetric, atraumatic MSK: No TTP at the thoracic or cervical spine. Neck: ROM normal except for decreased force at extension.  A chronic issue. Lungs:  CTA B Normal respiratory effort, no intercostal retractions, no accessory muscle use. Heart: RRR,  no murmur. Abdomen: Soft, nontender.  No mass Lower extremities: no pretibial edema bilaterally Skin: Not pale. Not jaundice Neurologic:  alert & oriented X3.  Speech normal, gait appropriate for age and unassisted Psych--  Cognition and judgment appear intact.  Cooperative with normal attention span and concentration.  Behavior appropriate. No anxious or depressed appearing.      Assessment     Assessment Insomnia  Reiter's syndrome: dx remotely and again 2010 ( left eye iritis, foot pain), Dr Amil Amen , sees opht GERD Seasonal allergies  Moles: Sees dermatology COVID infection 10-2020  PLAN Thoracic spine sprain. As described above, subsiding without  intervention. History and exam are benign. Recommend to avoid heavy lifting in the future, take Tylenol as needed, definitely call if symptoms resurface particularly if they are severe. Vaccine advised: Rec to proceed with COVID booster #2 at his convenience. RTC should be by September 2022 for CPX   This visit occurred during the SARS-CoV-2 public health emergency.  Safety protocols were in place, including screening questions prior to the visit, additional usage of staff PPE, and extensive cleaning of exam room while observing appropriate contact time as indicated for  disinfecting solutions.

## 2021-03-13 NOTE — Assessment & Plan Note (Signed)
Thoracic spine sprain. As described above, subsiding without  intervention. History and exam are benign. Recommend to avoid heavy lifting in the future, take Tylenol as needed, definitely call if symptoms resurface particularly if they are severe. Vaccine advised: Rec to proceed with COVID booster #2 at his convenience. RTC should be by September 2022 for CPX

## 2021-04-02 DIAGNOSIS — L570 Actinic keratosis: Secondary | ICD-10-CM | POA: Diagnosis not present

## 2021-04-02 DIAGNOSIS — L821 Other seborrheic keratosis: Secondary | ICD-10-CM | POA: Diagnosis not present

## 2021-04-02 DIAGNOSIS — D235 Other benign neoplasm of skin of trunk: Secondary | ICD-10-CM | POA: Diagnosis not present

## 2021-04-02 DIAGNOSIS — L814 Other melanin hyperpigmentation: Secondary | ICD-10-CM | POA: Diagnosis not present

## 2021-04-02 DIAGNOSIS — D229 Melanocytic nevi, unspecified: Secondary | ICD-10-CM | POA: Diagnosis not present

## 2021-04-02 DIAGNOSIS — X32XXXS Exposure to sunlight, sequela: Secondary | ICD-10-CM | POA: Diagnosis not present

## 2021-04-02 DIAGNOSIS — D485 Neoplasm of uncertain behavior of skin: Secondary | ICD-10-CM | POA: Diagnosis not present

## 2021-04-23 DIAGNOSIS — D485 Neoplasm of uncertain behavior of skin: Secondary | ICD-10-CM | POA: Diagnosis not present

## 2021-04-23 DIAGNOSIS — D235 Other benign neoplasm of skin of trunk: Secondary | ICD-10-CM | POA: Diagnosis not present

## 2021-04-30 DIAGNOSIS — L905 Scar conditions and fibrosis of skin: Secondary | ICD-10-CM | POA: Diagnosis not present

## 2021-05-07 DIAGNOSIS — M450 Ankylosing spondylitis of multiple sites in spine: Secondary | ICD-10-CM | POA: Diagnosis not present

## 2021-05-21 DIAGNOSIS — L918 Other hypertrophic disorders of the skin: Secondary | ICD-10-CM | POA: Diagnosis not present

## 2021-06-30 ENCOUNTER — Encounter: Payer: BC Managed Care – PPO | Admitting: Internal Medicine

## 2021-07-03 ENCOUNTER — Encounter: Payer: Self-pay | Admitting: Internal Medicine

## 2021-07-03 ENCOUNTER — Other Ambulatory Visit: Payer: Self-pay

## 2021-07-03 ENCOUNTER — Ambulatory Visit (INDEPENDENT_AMBULATORY_CARE_PROVIDER_SITE_OTHER): Payer: BC Managed Care – PPO | Admitting: Internal Medicine

## 2021-07-03 VITALS — BP 108/74 | HR 71 | Temp 98.0°F | Resp 16 | Ht 69.0 in | Wt 227.5 lb

## 2021-07-03 DIAGNOSIS — Z0000 Encounter for general adult medical examination without abnormal findings: Secondary | ICD-10-CM

## 2021-07-03 DIAGNOSIS — L409 Psoriasis, unspecified: Secondary | ICD-10-CM | POA: Insufficient documentation

## 2021-07-03 DIAGNOSIS — Z23 Encounter for immunization: Secondary | ICD-10-CM | POA: Diagnosis not present

## 2021-07-03 DIAGNOSIS — M459 Ankylosing spondylitis of unspecified sites in spine: Secondary | ICD-10-CM | POA: Insufficient documentation

## 2021-07-03 NOTE — Progress Notes (Signed)
Subjective:    Patient ID: Steven Lara, male    DOB: [birth year], 65 y.o.   MRN: [id number]  DOS:  07/03/2021 Type of visit - description: CPX Here for CPX. Doing well and has no major concerns   Review of Systems   A 14 point review of systems is negative    Past Medical History:  Diagnosis Date   Allergy    seasonal   Arthritis    Colon polyps    02/07/2007 Next Due: 02/2012 Results: Adenomatous Polyp (Dr Henrene Pastor)    Diverticulosis    GERD (gastroesophageal reflux disease) 07/14/2013   Insomnia    Mole (skin)    sees derm   Reiter's syndrome    dx remotely and again 12/2008 (L eye iritys R foot problems)    Past Surgical History:  Procedure Laterality Date   COLONOSCOPY W/ POLYPECTOMY     Social History   Socioeconomic History   Marital status: Widowed    Spouse name: Not on file   Number of children: 2   Years of education: Not on file   Highest education level: Not on file  Occupational History   Occupation: Glass blower/designer   Tobacco Use   Smoking status: Never   Smokeless tobacco: Never  Substance and Sexual Activity   Alcohol use: Yes    Comment: socially   Drug use: No   Sexual activity: Not on file  Other Topics Concern   Not on file  Social History Narrative   Married , wife dx w/ rectal cancer 2013-- going to [employer] [facility]) ; lost wife ~ 03-2014   Children    Daughter - grad school   Son - college    Social Determinants of Health   Financial Resource Strain: Not on file  Food Insecurity: Not on file  Transportation Needs: Not on file  Physical Activity: Not on file  Stress: Not on file  Social Connections: Not on file  Intimate Partner Violence: Not on file    Allergies as of 07/03/2021   No Known Allergies      Medication List        Accurate as of July 03, 2021 11:59 PM. If you have any questions, ask your nurse or doctor.          Adalimumab 40 MG/0.4ML Pskt Inject into the skin.   cetirizine 10 MG  tablet Commonly known as: ZYRTEC Take 10 mg by mouth daily.   fluticasone 50 MCG/ACT nasal spray Commonly known as: FLONASE Place 1 spray into both nostrils daily as needed for allergies or rhinitis.   folic acid 1 MG tablet Commonly known as: FOLVITE Take 1 mg by mouth daily.   methotrexate 2.5 MG tablet Commonly known as: RHEUMATREX Caution:Chemotherapy. Protect from light. USE AS DIRECTED   PRED FORTE OP Apply to eye as needed.   zolpidem 10 MG tablet Commonly known as: AMBIEN TAKE 1 TABLET(10 MG) BY MOUTH AT BEDTIME AS NEEDED FOR SLEEP           Objective:   Physical Exam BP 108/74 (BP Location: Left Arm, Patient Position: Sitting, Cuff Size: Normal)   Pulse 71   Temp 98 F (36.7 C) (Oral)   Resp 16   Ht 5\' 9"  (1.753 m)   Wt 227 lb 8 oz (103.2 kg)   SpO2 98%   BMI 33.60 kg/m  General: Well developed, NAD, BMI noted Neck: No  thyromegaly  HEENT:  Normocephalic . Face symmetric, atraumatic Lungs:  CTA B Normal respiratory effort, no intercostal retractions, no accessory muscle use. Heart: RRR,  no murmur.  Abdomen:  Not distended, soft, non-tender. No rebound or rigidity.   Lower extremities: no pretibial edema bilaterally DRE: Normal sphincter tone, no stools.  Prostate normal Skin: Exposed areas without rash. Not pale. Not jaundice Neurologic:  alert & oriented X3.  Speech normal, gait appropriate for age and unassisted Strength symmetric and appropriate for age.  Psych: Cognition and judgment appear intact.  Cooperative with normal attention span and concentration.  Behavior appropriate. No anxious or depressed appearing.     Assessment     Assessment Insomnia Reiter's syndrome: dx remotely and again 2010 ( left eye iritis, foot pain), Dr Amil Amen , sees opht GERD Seasonal allergies  Moles: Sees dermatology COVID infection 10-2020  PLAN Here for CPX Reiter's syndrome: On Humira, MTX, sees rheumatology regularly.  Doing well. RTC 1  year  This visit occurred during the SARS-CoV-2 public health emergency.  Safety protocols were in place, including screening questions prior to the visit, additional usage of staff PPE, and extensive cleaning of exam room while observing appropriate contact time as indicated for disinfecting solutions.

## 2021-07-03 NOTE — Patient Instructions (Addendum)
   GO TO THE LAB : Get the blood work     Jette, [place] Come back for physical exam in 1 year      "Living will", "[place] of attorney": Advanced care planning  (If you already have a living will or healthcare power of attorney, please bring the copy to be scanned in your chart.)  Advance care planning is a process that supports adults in  understanding and sharing their preferences regarding future medical care.   The patient's preferences are recorded in documents called Advance Directives.    Advanced directives are completed (and can be modified at any time) while the patient is in full mental capacity.   The documentation should be available at all times to the patient, the family and the healthcare providers.  Bring in a copy to be scanned in your chart is an excellent idea and is recommended   This legal documents direct treatment decision making and/or appoint a surrogate to make the decision if the patient is not capable to do so.    Advance directives can be documented in many types of formats,  documents have names such as:  Lliving will  Durable power of attorney for healthcare (healthcare proxy or healthcare power of attorney)  Combined directives  Physician orders for life-sustaining treatment    More information at:  [email or web address]

## 2021-07-04 ENCOUNTER — Encounter: Payer: Self-pay | Admitting: Internal Medicine

## 2021-07-04 LAB — PSA: PSA: 1.28 ng/mL (ref 0.10–4.00)

## 2021-07-04 LAB — LIPID PANEL
Cholesterol: 218 mg/dL — ABNORMAL HIGH (ref 0–200)
HDL: 54.4 mg/dL (ref >39.00)
LDL Cholesterol: 146 mg/dL — ABNORMAL HIGH (ref 0–99)
NonHDL: 163.13
Total CHOL/HDL Ratio: 4
Triglycerides: 84 mg/dL (ref 0.0–149.0)
VLDL: 16.8 mg/dL (ref 0.0–40.0)

## 2021-07-04 NOTE — Assessment & Plan Note (Signed)
-  adacel 03-16-2017   - PNM shot 2011 - prevnar-- 2016 - s/p shingrex x2 - s/p covid vaccines x3, booster recommended; evuxeld d/w pt -  Flu shot: Today - CCS: Colonoscopy (Dr Henrene Pastor)  02/07/2007, [place] 10-2016, 10 years per GI letter   -Prostate cancer screening: DRE normal today, no symptoms, check PSA -Labs: Get labs at rheumatology regularly, will get FLP, PSA -Exercise: doing well, continue biking regularly -POA discussed

## 2021-07-04 NOTE — Assessment & Plan Note (Signed)
Here for CPX Reiter's syndrome: On Humira, MTX, sees rheumatology regularly.  Doing well. RTC 1 year

## 2021-07-15 ENCOUNTER — Telehealth: Payer: Self-pay | Admitting: Internal Medicine

## 2021-07-15 NOTE — Telephone Encounter (Signed)
Requesting: Ambien 10mg  Contract: 03/11/2021 UDS: None Last Visit: 07/03/2021 Next Visit: None Last Refill: 01/16/2021 #30 and 5RF  Please Advise

## 2021-07-15 NOTE — Telephone Encounter (Signed)
PDMP okay, Rx sent 

## 2021-08-06 DIAGNOSIS — M5030 Other cervical disc degeneration, unspecified cervical region: Secondary | ICD-10-CM | POA: Diagnosis not present

## 2021-08-06 DIAGNOSIS — Z79899 Other long term (current) drug therapy: Secondary | ICD-10-CM | POA: Diagnosis not present

## 2021-08-06 DIAGNOSIS — H209 Unspecified iridocyclitis: Secondary | ICD-10-CM | POA: Diagnosis not present

## 2021-08-06 DIAGNOSIS — M450 Ankylosing spondylitis of multiple sites in spine: Secondary | ICD-10-CM | POA: Diagnosis not present

## 2021-08-14 ENCOUNTER — Other Ambulatory Visit: Payer: Self-pay | Admitting: Internal Medicine

## 2021-09-16 DIAGNOSIS — H35372 Puckering of macula, left eye: Secondary | ICD-10-CM | POA: Diagnosis not present

## 2021-09-16 DIAGNOSIS — H44113 Panuveitis, bilateral: Secondary | ICD-10-CM | POA: Diagnosis not present

## 2021-09-16 DIAGNOSIS — H2513 Age-related nuclear cataract, bilateral: Secondary | ICD-10-CM | POA: Diagnosis not present

## 2021-11-06 DIAGNOSIS — M450 Ankylosing spondylitis of multiple sites in spine: Secondary | ICD-10-CM | POA: Diagnosis not present

## 2022-01-07 ENCOUNTER — Encounter: Payer: Self-pay | Admitting: Internal Medicine

## 2022-01-13 ENCOUNTER — Telehealth: Payer: Self-pay | Admitting: Internal Medicine

## 2022-01-13 NOTE — Telephone Encounter (Signed)
Requesting: Ambien '10mg'$   ?Contract: 03/11/2021 ?UDS: N/a ?Last Visit: 07/03/2021 ?Next Visit: None ?Last Refill: 07/15/2021 #30 and 5RF ? ?Please Advise ? ?

## 2022-01-13 NOTE — Telephone Encounter (Signed)
PDMP okay, Rx sent 

## 2022-02-04 DIAGNOSIS — M5030 Other cervical disc degeneration, unspecified cervical region: Secondary | ICD-10-CM | POA: Diagnosis not present

## 2022-02-04 DIAGNOSIS — H209 Unspecified iridocyclitis: Secondary | ICD-10-CM | POA: Diagnosis not present

## 2022-02-04 DIAGNOSIS — M450 Ankylosing spondylitis of multiple sites in spine: Secondary | ICD-10-CM | POA: Diagnosis not present

## 2022-02-04 DIAGNOSIS — Z79899 Other long term (current) drug therapy: Secondary | ICD-10-CM | POA: Diagnosis not present

## 2022-02-12 DIAGNOSIS — H524 Presbyopia: Secondary | ICD-10-CM | POA: Diagnosis not present

## 2022-02-12 DIAGNOSIS — H5213 Myopia, bilateral: Secondary | ICD-10-CM | POA: Diagnosis not present

## 2022-02-12 DIAGNOSIS — H52203 Unspecified astigmatism, bilateral: Secondary | ICD-10-CM | POA: Diagnosis not present

## 2022-02-23 ENCOUNTER — Encounter: Payer: Self-pay | Admitting: Internal Medicine

## 2022-02-23 ENCOUNTER — Telehealth (INDEPENDENT_AMBULATORY_CARE_PROVIDER_SITE_OTHER): Payer: BC Managed Care – PPO | Admitting: Internal Medicine

## 2022-02-23 VITALS — Ht 69.0 in | Wt 230.0 lb

## 2022-02-23 DIAGNOSIS — R051 Acute cough: Secondary | ICD-10-CM

## 2022-02-23 MED ORDER — AZITHROMYCIN 250 MG PO TABS: ORAL_TABLET | ORAL | 0 refills | Status: DC

## 2022-02-23 NOTE — Progress Notes (Unsigned)
Subjective:    Patient ID: Steven Lara, male    DOB: 1956-03-31, 66 y.o.   MRN: [id number]  DOS:  02/23/2022 Type of visit - description: Virtual Visit via Video Note  I connected with the above patient  by a video enabled telemedicine application and verified that I am speaking with the correct person using two identifiers.   THIS ENCOUNTER IS A VIRTUAL VISIT DUE TO COVID-19 - PATIENT WAS NOT SEEN IN THE OFFICE. PATIENT HAS CONSENTED TO VIRTUAL VISIT / TELEMEDICINE VISIT   Location of patient: home  Location of provider: home  Persons participating in the virtual visit: patient, provider   I discussed the limitations of evaluation and management by telemedicine and the availability of in person appointments. The patient expressed understanding and agreed to proceed.  Acute: Symptoms started 2 weeks ago, tested negative for COVID 10 days ago: His main concern is cough, on and off, productive of small amount of yellow sputum, no hemoptysis. Cough sometimes wakes him up at night. Initially he thought he had allergies but denies itchy eyes nose or sneezing.  Admits to some sore throat and runny nose. No fever or chills.  Review of Systems See above   Past Medical History:  Diagnosis Date   Allergy    seasonal   Arthritis    Colon polyps    02/07/2007 Next Due: 02/2012 Results: Adenomatous Polyp (Dr Henrene Pastor)    Diverticulosis    GERD (gastroesophageal reflux disease) 07/14/2013   Insomnia    Mole (skin)    sees derm   Reiter's syndrome    dx remotely and again 12/2008 (L eye iritys R foot problems)    Past Surgical History:  Procedure Laterality Date   COLONOSCOPY W/ POLYPECTOMY      Current Outpatient Medications  Medication Instructions   Adalimumab 40 MG/0.4ML PSKT Subcutaneous   azithromycin (ZITHROMAX Z-PAK) 250 MG tablet 2 tabs a day the first day, then 1 tab a day x 4 days   cetirizine (ZYRTEC) 10 mg, Daily   fluticasone (FLONASE) 50 MCG/ACT nasal spray 1  spray, Daily PRN   folic acid (FOLVITE) 1 mg, Oral, Daily,     methotrexate (RHEUMATREX) 2.5 MG tablet Caution:Chemotherapy. Protect from light. USE AS DIRECTED   PrednisoLONE Acetate (PRED FORTE OP) As needed   zolpidem (AMBIEN) 10 MG tablet TAKE 1 TABLET BY MOUTH EVERY NIGHT AT BEDTIME AS NEEDED FOR SLEEP       Objective:   Physical Exam Ht '5\' 9"'$  (1.753 m)   Wt 230 lb (104.3 kg)   BMI 33.97 kg/m  This is a virtual video visit, he looks well, alert oriented x3.  No cough during the visit.  Some nasal congestion noted.  No vital signs available.    Assessment     Assessment Insomnia Reiter's syndrome: dx remotely and again 2010 ( left eye iritis, foot pain), Dr Amil Amen , sees opht GERD Seasonal allergies  Moles: Sees dermatology COVID infection 10-2020  PLAN Cough: Going on for 2 weeks, productive >>  small amount of yellowish sputum. No wheezing, no distress. He is immunosuppressed. Suspect bronchitis, recommend to continue Robitussin, Flonase, Zyrtec.  Will rx a Z-Pak. Patient to let me know if he is not gradually better.   I discussed the assessment and treatment plan with the patient. The patient was provided an opportunity to ask questions and all were answered. The patient agreed with the plan and demonstrated an understanding of the instructions.   The patient  was advised to call back or seek an in-person evaluation if the symptoms worsen or if the condition fails to improve as anticipated.

## 2022-02-24 NOTE — Assessment & Plan Note (Signed)
Cough: Going on for 2 weeks, productive >>  small amount of yellowish sputum. No wheezing, no distress. He is immunosuppressed. Suspect bronchitis, recommend to continue Robitussin, Flonase, Zyrtec.  Will rx a Z-Pak. Patient to let me know if he is not gradually better.

## 2022-03-15 DIAGNOSIS — M25511 Pain in right shoulder: Secondary | ICD-10-CM | POA: Diagnosis not present

## 2022-03-16 DIAGNOSIS — X32XXXS Exposure to sunlight, sequela: Secondary | ICD-10-CM | POA: Diagnosis not present

## 2022-03-16 DIAGNOSIS — L821 Other seborrheic keratosis: Secondary | ICD-10-CM | POA: Diagnosis not present

## 2022-03-16 DIAGNOSIS — L814 Other melanin hyperpigmentation: Secondary | ICD-10-CM | POA: Diagnosis not present

## 2022-03-16 DIAGNOSIS — D225 Melanocytic nevi of trunk: Secondary | ICD-10-CM | POA: Diagnosis not present

## 2022-05-07 DIAGNOSIS — M450 Ankylosing spondylitis of multiple sites in spine: Secondary | ICD-10-CM | POA: Diagnosis not present

## 2022-05-26 DIAGNOSIS — H44113 Panuveitis, bilateral: Secondary | ICD-10-CM | POA: Diagnosis not present

## 2022-05-26 DIAGNOSIS — H35372 Puckering of macula, left eye: Secondary | ICD-10-CM | POA: Diagnosis not present

## 2022-05-26 DIAGNOSIS — H2513 Age-related nuclear cataract, bilateral: Secondary | ICD-10-CM | POA: Diagnosis not present

## 2022-06-09 ENCOUNTER — Telehealth: Payer: Self-pay | Admitting: Internal Medicine

## 2022-06-09 NOTE — Telephone Encounter (Signed)
Requesting: Ambien '10mg'$   Contract: 03/11/21 UDS: None Last Visit: 02/23/22 Next Visit: 07/06/22 Last Refill: 01/13/22 #30 and 4rf  Please Advise

## 2022-06-10 NOTE — Telephone Encounter (Signed)
PDMP review, prescription sent 

## 2022-07-06 ENCOUNTER — Ambulatory Visit (INDEPENDENT_AMBULATORY_CARE_PROVIDER_SITE_OTHER): Payer: BC Managed Care – PPO | Admitting: Internal Medicine

## 2022-07-06 ENCOUNTER — Encounter: Payer: Self-pay | Admitting: Internal Medicine

## 2022-07-06 VITALS — BP 124/68 | HR 61 | Temp 98.1°F | Resp 16 | Ht 69.0 in | Wt 234.5 lb

## 2022-07-06 DIAGNOSIS — M0230 Reiter's disease, unspecified site: Secondary | ICD-10-CM

## 2022-07-06 DIAGNOSIS — Z23 Encounter for immunization: Secondary | ICD-10-CM | POA: Diagnosis not present

## 2022-07-06 DIAGNOSIS — Z0000 Encounter for general adult medical examination without abnormal findings: Secondary | ICD-10-CM | POA: Diagnosis not present

## 2022-07-06 DIAGNOSIS — Z125 Encounter for screening for malignant neoplasm of prostate: Secondary | ICD-10-CM | POA: Diagnosis not present

## 2022-07-06 LAB — LIPID PANEL
Cholesterol: 219 mg/dL — ABNORMAL HIGH (ref 0–200)
HDL: 55.2 mg/dL (ref >39.00)
LDL Cholesterol: 147 mg/dL — ABNORMAL HIGH (ref 0–99)
NonHDL: 164.17
Total CHOL/HDL Ratio: 4
Triglycerides: 86 mg/dL (ref 0.0–149.0)
VLDL: 17.2 mg/dL (ref 0.0–40.0)

## 2022-07-06 LAB — PSA: PSA: 1.07 ng/mL (ref 0.10–4.00)

## 2022-07-06 LAB — TSH: TSH: 2.89 u[IU]/mL (ref 0.35–5.50)

## 2022-07-06 NOTE — Assessment & Plan Note (Signed)
Here for CPX Insomnia: On Ambien, RF as needed Reiter's syndrome: Sees rheumatology, ophthalmology.  On Humira and MTX.  Gets labs there regularly.  We will try to get records. EKG today:NSR, no acute RTC 1 year

## 2022-07-06 NOTE — Patient Instructions (Addendum)
Vaccines I recommend:  Covid booster RSV vaccine     GO TO THE LAB : Get the blood work     [place], [place] back for   a physical exam in 1 year    Advanced care planning:  Do you have a "Living will", "Emmons of attorney" ?   If you already have a living will or healthcare power of attorney, is recommended you bring the copy to be scanned in your chart. The document will be available to all the doctors you see in the system.  If you don't have one, please consider create one.  Advance care planning is a process that supports adults in  understanding and sharing their preferences regarding future medical care.   The patient's preferences are recorded in documents called Advance Directives.    Advanced directives are completed (and can be modified at any time) while the patient is in full mental capacity.   The documentation should be available at all times to the patient, the family and the healthcare providers.   This legal documents direct treatment decision making and/or appoint a surrogate to make the decision if the patient is not capable to do so.    Advance directives can be documented in many types of formats,  documents have names such as:  Lliving will  Durable power of attorney for healthcare (healthcare proxy or healthcare power of attorney)  Combined directives  Physician orders for life-sustaining treatment    More information at:  [email or web address]

## 2022-07-06 NOTE — Assessment & Plan Note (Signed)
-   Tdap 03-16-2017   - PNM shot 2011 - prevnar: 2016 - PNM 20: Today - s/p shingrex x2 - RSV d/w pt  - covid vaccines d/w pt  -  Flu shot: Today - CCS: Colonoscopy (Dr Henrene Pastor)  02/07/2007, Cscope 10-2016, 10 years per GI letter   -Prostate cancer screening: DRE-PSA wnl 2022.  Currently with no symptoms, check PSA. -Labs: Get labs at rheumatology regularly, will get FLP TSH PSA today -Exercise: doing well, continue biking around 60 miles a week -POA: Information provided.  See AVS.

## 2022-07-06 NOTE — Progress Notes (Signed)
Subjective:    Patient ID: Steven Lara, male    DOB: 25-Nov-1955, 66 y.o.   MRN: [id number]  DOS:  07/06/2022 Type of visit - description: cpx  Since the last office visit is doing okay, here for CPX. Essentially no new or worrisome concerns or symptoms.  Review of Systems  Other than above, a 14 point review of systems is negative    Past Medical History:  Diagnosis Date   Allergy    seasonal   Arthritis    Colon polyps    02/07/2007 Next Due: 02/2012 Results: Adenomatous Polyp (Dr Henrene Pastor)    Diverticulosis    GERD (gastroesophageal reflux disease) 07/14/2013   Insomnia    Mole (skin)    sees derm   Reiter's syndrome    dx remotely and again 12/2008 (L eye iritys R foot problems)    Past Surgical History:  Procedure Laterality Date   COLONOSCOPY W/ POLYPECTOMY     Social History   Socioeconomic History   Marital status: Widowed    Spouse name: Not on file   Number of children: 2   Years of education: Not on file   Highest education level: Not on file  Occupational History   Occupation: Glass blower/designer   Tobacco Use   Smoking status: Never   Smokeless tobacco: Never  Substance and Sexual Activity   Alcohol use: Yes    Comment: socially   Drug use: No   Sexual activity: Not on file  Other Topics Concern   Not on file  Social History Narrative   Married , wife dx w/ rectal cancer 2013-- going to [employer] [facility]) ; lost wife ~ 03-2014   Children    Daughter - grad school, finished a Armed forces training and education officer - college    Social Determinants of Radio broadcast assistant Strain: Not on Art therapist Insecurity: Not on file  Transportation Needs: Not on file  Physical Activity: Not on file  Stress: Not on file  Social Connections: Not on file  Intimate Partner Violence: Not on file    Current Outpatient Medications  Medication Instructions   cetirizine (ZYRTEC) 10 mg, Daily   fluticasone (FLONASE) 50 MCG/ACT nasal spray 1 spray, Daily PRN   folic acid  (FOLVITE) 1 mg, Oral, Daily,     HUMIRA PEN 40 MG/0.4ML PNKT SMARTSIG:40 Milligram(s) SUB-Q Every 2 Weeks   methotrexate (RHEUMATREX) 2.5 MG tablet Caution:Chemotherapy. Protect from light. USE AS DIRECTED   zolpidem (AMBIEN) 10 MG tablet TAKE 1 TABLET BY MOUTH EVERY NIGHT AT BEDTIME AS NEEDED FOR SLEEP       Objective:   Physical Exam BP 124/68   Pulse 61   Temp 98.1 F (36.7 C) (Oral)   Resp 16   Ht '5\' 9"'$  (1.753 m)   Wt 234 lb 8 oz (106.4 kg)   SpO2 97%   BMI 34.63 kg/m  General: Well developed, NAD, BMI noted Neck: No  thyromegaly  HEENT:  Normocephalic . Face symmetric, atraumatic Lungs:  CTA B Normal respiratory effort, no intercostal retractions, no accessory muscle use. Heart: RRR,  no murmur.  Abdomen:  Not distended, soft, non-tender. No rebound or rigidity.   Lower extremities: no pretibial edema bilaterally  Skin: Exposed areas without rash. Not pale. Not jaundice Neurologic:  alert & oriented X3.  Speech normal, gait appropriate for age and unassisted Strength symmetric and appropriate for age.  Psych: Cognition and judgment appear intact.  Cooperative with normal attention  span and concentration.  Behavior appropriate. No anxious or depressed appearing.     Assessment    Assessment Insomnia Reiter's syndrome: dx remotely and again 2010 ( left eye iritis, foot pain), Dr Amil Amen , sees opht GERD Seasonal allergies  Moles: Sees dermatology COVID infection 10-2020  PLAN Here for CPX Insomnia: On Ambien, RF as needed Reiter's syndrome: Sees rheumatology, ophthalmology.  On Humira and MTX.  Gets labs there regularly.  We will try to get records. EKG today:NSR, no acute RTC 1 year

## 2022-07-30 DIAGNOSIS — L820 Inflamed seborrheic keratosis: Secondary | ICD-10-CM | POA: Diagnosis not present

## 2022-07-30 DIAGNOSIS — B078 Other viral warts: Secondary | ICD-10-CM | POA: Diagnosis not present

## 2022-08-06 ENCOUNTER — Telehealth: Payer: Self-pay

## 2022-08-06 NOTE — Telephone Encounter (Signed)
Records received

## 2022-08-06 NOTE — Telephone Encounter (Signed)
[facility] called back stating they will fax the notes to [place] today. Fax number given.

## 2022-08-06 NOTE — Telephone Encounter (Signed)
LM on [place] [phone]) medical records line- checking status of medical records (ROI fax to them on 07/06/22). Requesting last 3-4 OV notes and labs. Asked for call back.

## 2022-08-17 DIAGNOSIS — H209 Unspecified iridocyclitis: Secondary | ICD-10-CM | POA: Diagnosis not present

## 2022-08-17 DIAGNOSIS — M5030 Other cervical disc degeneration, unspecified cervical region: Secondary | ICD-10-CM | POA: Diagnosis not present

## 2022-08-17 DIAGNOSIS — M450 Ankylosing spondylitis of multiple sites in spine: Secondary | ICD-10-CM | POA: Diagnosis not present

## 2022-08-17 DIAGNOSIS — Z79899 Other long term (current) drug therapy: Secondary | ICD-10-CM | POA: Diagnosis not present

## 2022-09-15 ENCOUNTER — Ambulatory Visit: Payer: BC Managed Care – PPO | Admitting: Medical

## 2022-09-15 ENCOUNTER — Encounter: Payer: Self-pay | Admitting: Medical

## 2022-09-15 VITALS — BP 130/80 | HR 76 | Temp 98.0°F | Resp 18 | Ht 69.0 in | Wt 237.0 lb

## 2022-09-15 DIAGNOSIS — R319 Hematuria, unspecified: Secondary | ICD-10-CM

## 2022-09-15 DIAGNOSIS — R350 Frequency of micturition: Secondary | ICD-10-CM

## 2022-09-15 DIAGNOSIS — R300 Dysuria: Secondary | ICD-10-CM | POA: Diagnosis not present

## 2022-09-15 LAB — POCT URINALYSIS DIPSTICK
Bilirubin, UA: NEGATIVE
Glucose, UA: NEGATIVE
Ketones, UA: NEGATIVE
Leukocytes, UA: NEGATIVE
Nitrite, UA: NEGATIVE
Protein, UA: NEGATIVE
Spec Grav, UA: 1.02 (ref 1.010–1.025)
Urobilinogen, UA: 0.2 E.U./dL
pH, UA: 6 (ref 5.0–8.0)

## 2022-09-15 MED ORDER — DOXYCYCLINE HYCLATE 100 MG PO TABS: 100.0000 mg | ORAL_TABLET | Freq: Two times a day (BID) | ORAL | 0 refills | Status: DC

## 2022-09-15 NOTE — Patient Instructions (Addendum)
Moderate frequent of urination and mild discomfort on urination. Hx of urethritis reported. Some trace blood present. Will get urine culture and get psa lab.(Uti, prostatitis vs bph)  Hydrate well and start doxycycline antibiotic. Rx advisement given.  If signs and symptoms worsen or change notify us.  May refer to urologist but need to follow labs and response to tx first.  Follow up 2 weeks or sooner if needed.
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
## 2022-09-16 LAB — URINE CULTURE
MICRO NUMBER:: 14303495
Result:: NO GROWTH
SPECIMEN QUALITY:: ADEQUATE

## 2022-09-16 LAB — PSA: PSA: 1.08 ng/mL (ref 0.10–4.00)

## 2022-09-16 NOTE — Addendum Note (Signed)
Addended by: Anabel Halon on: 09/16/2022 12:32 PM   Modules accepted: Orders

## 2022-09-29 ENCOUNTER — Other Ambulatory Visit (INDEPENDENT_AMBULATORY_CARE_PROVIDER_SITE_OTHER): Payer: BC Managed Care – PPO

## 2022-09-29 DIAGNOSIS — R319 Hematuria, unspecified: Secondary | ICD-10-CM | POA: Diagnosis not present

## 2022-09-29 LAB — POC URINALSYSI DIPSTICK (AUTOMATED)
Bilirubin, UA: NEGATIVE
Blood, UA: NEGATIVE
Clarity, UA: NEGATIVE
Glucose, UA: NEGATIVE
Ketones, UA: NEGATIVE
Nitrite, UA: NEGATIVE
Protein, UA: NEGATIVE
Spec Grav, UA: <=1.005 — AB (ref 1.010–1.025)
Urobilinogen, UA: 0.2 E.U./dL
pH, UA: 7.5 (ref 5.0–8.0)

## 2022-09-30 ENCOUNTER — Telehealth: Payer: Self-pay | Admitting: *Deleted

## 2022-09-30 DIAGNOSIS — R319 Hematuria, unspecified: Secondary | ICD-10-CM

## 2022-09-30 DIAGNOSIS — R300 Dysuria: Secondary | ICD-10-CM

## 2022-09-30 DIAGNOSIS — R350 Frequency of micturition: Secondary | ICD-10-CM

## 2022-09-30 LAB — URINE CULTURE
MICRO NUMBER:: 14356676
Result:: NO GROWTH
SPECIMEN QUALITY:: ADEQUATE

## 2022-09-30 NOTE — Telephone Encounter (Signed)
Patient stated that he was still having some discomfort.  He did finish up antibiotic.  Advised that we will refer him to urology.    Referral placed.

## 2022-09-30 NOTE — Telephone Encounter (Signed)
  Your urine looks concentrated and no blood present on repeat. Some trace infection fighting cells. Do you have any recurrent urinary symptoms? If so would recommend referring to urologist.

## 2022-10-08 ENCOUNTER — Ambulatory Visit: Payer: BC Managed Care – PPO | Admitting: Urology

## 2022-10-15 ENCOUNTER — Encounter: Payer: Self-pay | Admitting: Urology

## 2022-10-15 ENCOUNTER — Ambulatory Visit: Payer: BC Managed Care – PPO | Admitting: Urology

## 2022-10-15 VITALS — BP 135/89 | HR 63 | Ht 69.0 in | Wt 230.0 lb

## 2022-10-15 DIAGNOSIS — R300 Dysuria: Secondary | ICD-10-CM | POA: Diagnosis not present

## 2022-10-15 DIAGNOSIS — R339 Retention of urine, unspecified: Secondary | ICD-10-CM

## 2022-10-15 LAB — URINALYSIS
Bilirubin, UA: NEGATIVE
Blood, UA: NEGATIVE
Glucose, UA: NEGATIVE mg/dL
Ketones, UA: NEGATIVE
Leukocytes, UA: NEGATIVE
Nitrite, UA: NEGATIVE
Protein, UA: NEGATIVE
Spec Grav, UA: 1.01 (ref 1.010–1.025)
Urobilinogen, UA: 0.2 E.U./dL
pH, UA: 6.5 (ref 5.0–8.0)

## 2022-10-15 LAB — BLADDER SCAN AMB NON-IMAGING

## 2022-10-15 MED ORDER — ALFUZOSIN HCL ER 10 MG PO TB24: 10.0000 mg | ORAL_TABLET | Freq: Every day | ORAL | 11 refills | Status: DC

## 2022-10-15 NOTE — Progress Notes (Signed)
Assessment: 1. Dysuria   2. Incomplete bladder emptying     Plan: I personally reviewed the patient's chart including provider notes, lab results. I also reviewed a prior urology note from Dr. Nevada Crane in 2014. Trial of alfuzosin 10 mg daily.  Rx sent. Return to office in 1 month  Chief Complaint:  Chief Complaint  Patient presents with   Dysuria    History of Present Illness:  Steven Lara is a 67 y.o. male who is seen in consultation from Mackie Pai, PA-C for evaluation of frequency, urgency, and mild dysuria.  He has a history of Reiter's syndrome and has previously been evaluated for urethritis/prostatitis.  He was seen by Dr. Jonette Eva in October 2014.  His symptoms resolved at that time.  He reports onset of symptoms approximately 6 weeks ago with increased frequency, urgency, and constant sensation of dysuria.  He also has nocturia x 2.  He was given a course of doxycycline x 10 days with some slight improvement in his symptoms.  He continues with frequency, urgency, and dysuria.  He voids with a good stream.  No gross hematuria. Dipstick urinalysis from him 09/15/2022 showed trace blood.  Urine culture showed no growth. Repeat urinalysis from 09/29/2022 showed 1+ leukocytes.  Urine culture again showed no growth.  PSA from 11/16/2021: 1.08  Past Medical History:  Past Medical History:  Diagnosis Date   Allergy    seasonal   Arthritis    Colon polyps    02/07/2007 Next Due: 02/2012 Results: Adenomatous Polyp (Dr Henrene Pastor)    Diverticulosis    GERD (gastroesophageal reflux disease) 07/14/2013   Insomnia    Mole (skin)    sees derm   Reiter's syndrome    dx remotely and again 12/2008 (L eye iritys R foot problems)    Past Surgical History:  Past Surgical History:  Procedure Laterality Date   COLONOSCOPY W/ POLYPECTOMY      Allergies:  No Known Allergies  Family History:  Family History  Problem Relation Age of Onset   Dementia Father    Prostate cancer  Father        question of    Arthritis Brother    COPD Mother    Heart attack Neg Hx    Diabetes Neg Hx    Colon cancer Neg Hx     Social History:  Social History   Tobacco Use   Smoking status: Never   Smokeless tobacco: Never  Substance Use Topics   Alcohol use: Yes    Comment: socially   Drug use: No    Review of symptoms:  Constitutional:  Negative for unexplained weight loss, night sweats, fever, chills ENT:  Negative for nose bleeds, sinus pain, painful swallowing CV:  Negative for chest pain, shortness of breath, exercise intolerance, palpitations, loss of consciousness Resp:  Negative for cough, wheezing, shortness of breath GI:  Negative for nausea, vomiting, diarrhea, bloody stools GU:  Positives noted in HPI; otherwise negative for gross hematuria, urinary incontinence Neuro:  Negative for seizures, poor balance, limb weakness, slurred speech Psych:  Negative for lack of energy, depression, anxiety Endocrine:  Negative for polydipsia, polyuria, symptoms of hypoglycemia (dizziness, hunger, sweating) Hematologic:  Negative for anemia, purpura, petechia, prolonged or excessive bleeding, use of anticoagulants  Allergic:  Negative for difficulty breathing or choking as a result of exposure to anything; no shellfish allergy; no allergic response (rash/itch) to materials, foods  Physical exam: BP 135/89   Pulse 63   Ht  $'5\' 9"'e$  (1.753 m)   Wt 230 lb (104.3 kg)   BMI 33.97 kg/m  GENERAL APPEARANCE:  Well appearing, well developed, well nourished, NAD HEENT: Atraumatic, Normocephalic, oropharynx clear. NECK: Supple without lymphadenopathy or thyromegaly. LUNGS: Clear to auscultation bilaterally. HEART: Regular Rate and Rhythm without murmurs, gallops, or rubs. ABDOMEN: Soft, non-tender, No Masses. EXTREMITIES: Moves all extremities well.  Without clubbing, cyanosis, or edema. NEUROLOGIC:  Alert and oriented x 3, normal gait, CN II-XII grossly intact.  MENTAL STATUS:   Appropriate. BACK:  Non-tender to palpation.  No CVAT SKIN:  Warm, dry and intact.   GU: Penis:  circumcised Meatus: Normal Scrotum: normal, no masses Testis: normal without masses bilateral Epididymis: normal Prostate: 40 g, NT, no nodules Rectum: Normal tone,  no masses or tenderness   Results: U/A dipstick negative  PVR = 94 ml

## 2022-11-17 ENCOUNTER — Ambulatory Visit: Payer: BC Managed Care – PPO | Admitting: Urology

## 2022-11-17 ENCOUNTER — Encounter: Payer: Self-pay | Admitting: Urology

## 2022-11-17 VITALS — BP 134/87 | HR 64 | Ht 69.0 in | Wt 230.0 lb

## 2022-11-17 DIAGNOSIS — R339 Retention of urine, unspecified: Secondary | ICD-10-CM

## 2022-11-17 DIAGNOSIS — N138 Other obstructive and reflux uropathy: Secondary | ICD-10-CM

## 2022-11-17 DIAGNOSIS — N401 Enlarged prostate with lower urinary tract symptoms: Secondary | ICD-10-CM | POA: Diagnosis not present

## 2022-11-17 DIAGNOSIS — M450 Ankylosing spondylitis of multiple sites in spine: Secondary | ICD-10-CM | POA: Diagnosis not present

## 2022-11-17 LAB — URINALYSIS
Bilirubin, UA: NEGATIVE
Glucose, UA: NEGATIVE mg/dL
Ketones, UA: NEGATIVE
Leukocytes, UA: NEGATIVE
Nitrite, UA: NEGATIVE
Protein, UA: NEGATIVE
Spec Grav, UA: 1.025 (ref 1.010–1.025)
Urobilinogen, UA: 0.2 E.U./dL
pH, UA: 6 (ref 5.0–8.0)

## 2022-11-17 LAB — BLADDER SCAN AMB NON-IMAGING

## 2022-11-17 NOTE — Progress Notes (Signed)
Assessment: 1. BPH with obstruction/lower urinary tract symptoms   2. Incomplete bladder emptying     Plan: Continue alfuzosin 10 mg daily.   Return to office in 3 months  Chief Complaint:  Chief Complaint  Patient presents with   Dysuria    History of Present Illness:  Steven Lara is a 67 y.o. male who is seen for further evaluation of frequency, urgency, and mild dysuria.  He has a history of Reiter's syndrome and has previously been evaluated for urethritis/prostatitis.  He was seen by Dr. Jonette Eva in October 2014.  His symptoms resolved at that time.  At his visit in 1/24, he reported onset of urinary symptoms approximately 6 weeks prior with increased frequency, urgency, and constant sensation of dysuria.  He also had nocturia x 2.  He was given a course of doxycycline x 10 days with some slight improvement in his symptoms.  He continued with frequency, urgency, and dysuria.  He was voiding with a good stream.  No gross hematuria. Dipstick urinalysis from him 09/15/2022 showed trace blood.  Urine culture showed no growth. Repeat urinalysis from 09/29/2022 showed 1+ leukocytes.  Urine culture again showed no growth.  PSA from 09/15/2022: 1.08 PVR = 94 ml.  He was given a trial of alfuzosin 10 mg daily in 1/24.  He returns today for scheduled follow-up.  He continues on alfuzosin 10 mg daily. He has noted improvement in his lower urinary tract symptoms with improved force of stream, less hesitancy, and sensation of complete emptying.  He is not having any dysuria or gross hematuria.  Overall, he is pleased with the results of the medication.  No side effects. IPSS = 8 today today.  Portions of the above documentation were copied from a prior visit for review purposes only.   Past Medical History:  Past Medical History:  Diagnosis Date   Allergy    seasonal   Arthritis    Colon polyps    02/07/2007 Next Due: 02/2012 Results: Adenomatous Polyp (Dr Henrene Pastor)     Diverticulosis    GERD (gastroesophageal reflux disease) 07/14/2013   Insomnia    Mole (skin)    sees derm   Reiter's syndrome    dx remotely and again 12/2008 (L eye iritys R foot problems)    Past Surgical History:  Past Surgical History:  Procedure Laterality Date   COLONOSCOPY W/ POLYPECTOMY      Allergies:  No Known Allergies  Family History:  Family History  Problem Relation Age of Onset   Dementia Father    Prostate cancer Father        question of    Arthritis Brother    COPD Mother    Heart attack Neg Hx    Diabetes Neg Hx    Colon cancer Neg Hx     Social History:  Social History   Tobacco Use   Smoking status: Never   Smokeless tobacco: Never  Substance Use Topics   Alcohol use: Yes    Comment: socially   Drug use: No    ROS: Constitutional:  Negative for fever, chills, weight loss CV: Negative for chest pain, previous MI, hypertension Respiratory:  Negative for shortness of breath, wheezing, sleep apnea, frequent cough GI:  Negative for nausea, vomiting, bloody stool, GERD  Physical exam: BP 134/87   Pulse 64   Ht 5' 9"$  (1.753 m)   Wt 230 lb (104.3 kg)   BMI 33.97 kg/m  GENERAL APPEARANCE:  Well appearing,  well developed, well nourished, NAD HEENT:  Atraumatic, normocephalic, oropharynx clear NECK:  Supple without lymphadenopathy or thyromegaly ABDOMEN:  Soft, non-tender, no masses EXTREMITIES:  Moves all extremities well, without clubbing, cyanosis, or edema NEUROLOGIC:  Alert and oriented x 3, normal gait, CN II-XII grossly intact MENTAL STATUS:  appropriate BACK:  Non-tender to palpation, No CVAT SKIN:  Warm, dry, and intact   Results: U/A dipstick: Trace blood  PVR: 79 ml

## 2022-12-07 ENCOUNTER — Telehealth: Payer: Self-pay | Admitting: Internal Medicine

## 2022-12-07 NOTE — Telephone Encounter (Signed)
Requesting: Ambien '10mg'$   Contract: 03/11/21 UDS: None Last Visit: 07/06/22 Next Visit: None Last Refill: 06/10/22 #30 and 5RF  Please Advise

## 2022-12-07 NOTE — Telephone Encounter (Signed)
PDMP okay, Rx sent 

## 2023-01-15 ENCOUNTER — Encounter: Payer: Self-pay | Admitting: Internal Medicine

## 2023-02-16 DIAGNOSIS — Z79899 Other long term (current) drug therapy: Secondary | ICD-10-CM | POA: Diagnosis not present

## 2023-02-16 DIAGNOSIS — H209 Unspecified iridocyclitis: Secondary | ICD-10-CM | POA: Diagnosis not present

## 2023-02-16 DIAGNOSIS — M450 Ankylosing spondylitis of multiple sites in spine: Secondary | ICD-10-CM | POA: Diagnosis not present

## 2023-02-16 DIAGNOSIS — M5030 Other cervical disc degeneration, unspecified cervical region: Secondary | ICD-10-CM | POA: Diagnosis not present

## 2023-03-17 DIAGNOSIS — D229 Melanocytic nevi, unspecified: Secondary | ICD-10-CM | POA: Diagnosis not present

## 2023-03-17 DIAGNOSIS — D235 Other benign neoplasm of skin of trunk: Secondary | ICD-10-CM | POA: Diagnosis not present

## 2023-03-17 DIAGNOSIS — L821 Other seborrheic keratosis: Secondary | ICD-10-CM | POA: Diagnosis not present

## 2023-03-17 DIAGNOSIS — D485 Neoplasm of uncertain behavior of skin: Secondary | ICD-10-CM | POA: Diagnosis not present

## 2023-03-17 DIAGNOSIS — L814 Other melanin hyperpigmentation: Secondary | ICD-10-CM | POA: Diagnosis not present

## 2023-05-18 DIAGNOSIS — M450 Ankylosing spondylitis of multiple sites in spine: Secondary | ICD-10-CM | POA: Diagnosis not present

## 2023-06-03 ENCOUNTER — Telehealth: Payer: Self-pay | Admitting: Internal Medicine

## 2023-06-03 NOTE — Telephone Encounter (Signed)
Requesting: Ambien 10mg   Contract: 03/11/21 UDS: None Last Visit: 07/06/22 Next Visit: 08/04/23 Last Refill: 12/07/22 #30 and 5RF  Please Advise

## 2023-06-03 NOTE — Telephone Encounter (Signed)
PDMP okay, Rx sent 

## 2023-08-04 ENCOUNTER — Ambulatory Visit: Payer: BC Managed Care – PPO | Admitting: Internal Medicine

## 2023-08-04 ENCOUNTER — Encounter: Payer: Self-pay | Admitting: Internal Medicine

## 2023-08-04 VITALS — BP 126/84 | HR 76 | Temp 98.0°F | Resp 16 | Ht 69.0 in | Wt 238.1 lb

## 2023-08-04 DIAGNOSIS — Z0000 Encounter for general adult medical examination without abnormal findings: Secondary | ICD-10-CM

## 2023-08-04 DIAGNOSIS — Z23 Encounter for immunization: Secondary | ICD-10-CM

## 2023-08-04 LAB — TSH: TSH: 1.92 u[IU]/mL (ref 0.35–5.50)

## 2023-08-04 LAB — HEPATIC FUNCTION PANEL
ALT: 19 U/L (ref 0–53)
AST: 21 U/L (ref 0–37)
Albumin: 3.9 g/dL (ref 3.5–5.2)
Alkaline Phosphatase: 59 U/L (ref 39–117)
Bilirubin, Direct: 0.2 mg/dL (ref 0.0–0.3)
Total Bilirubin: 1.3 mg/dL — ABNORMAL HIGH (ref 0.2–1.2)
Total Protein: 6.6 g/dL (ref 6.0–8.3)

## 2023-08-04 LAB — PSA: PSA: 1.65 ng/mL (ref 0.10–4.00)

## 2023-08-04 NOTE — Assessment & Plan Note (Signed)
Here for CPX Insomnia: On Ambien, RF as needed. Reiter's syndrome, ankylosis spondylitis: Transitioning from MTX to Humira, under the care of rheumatology. BPH: Per urology Social: Doing well, his daughter is living with him for now. RTC 1 year
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
## 2023-08-04 NOTE — Patient Instructions (Addendum)
Vaccines I recommend: Covid booster RSV vaccine     GO TO THE LAB : Get the blood work     Next visit with me in 1 year for your physical exam. Please schedule it at the front desk        "Health Care Power of attorney" ,  "Living will" (Advance care planning documents)  If you already have a living will or healthcare power of attorney, is recommended you bring the copy to be scanned in your chart.   The document will be available to all the doctors you see in the system.  Advance care planning is a process that supports adults in  understanding and sharing their preferences regarding future medical care.  The patient's preferences are recorded in documents called Advance Directives and the can be modified at any time while the patient is in full mental capacity.   If you don't have one, please consider create one.      More information at: [email or web address]

## 2023-08-17 DIAGNOSIS — M450 Ankylosing spondylitis of multiple sites in spine: Secondary | ICD-10-CM | POA: Diagnosis not present

## 2023-08-17 DIAGNOSIS — H209 Unspecified iridocyclitis: Secondary | ICD-10-CM | POA: Diagnosis not present

## 2023-08-17 DIAGNOSIS — Z79899 Other long term (current) drug therapy: Secondary | ICD-10-CM | POA: Diagnosis not present

## 2023-08-17 DIAGNOSIS — M5030 Other cervical disc degeneration, unspecified cervical region: Secondary | ICD-10-CM | POA: Diagnosis not present

## 2023-10-22 ENCOUNTER — Ambulatory Visit: Payer: BC Managed Care – PPO | Admitting: Family Medicine

## 2023-10-22 ENCOUNTER — Encounter: Payer: Self-pay | Admitting: Family Medicine

## 2023-10-22 DIAGNOSIS — Z6836 Body mass index (BMI) 36.0-36.9, adult: Secondary | ICD-10-CM

## 2023-10-22 DIAGNOSIS — J40 Bronchitis, not specified as acute or chronic: Secondary | ICD-10-CM | POA: Diagnosis not present

## 2023-10-22 MED ORDER — AZITHROMYCIN 250 MG PO TABS: ORAL_TABLET | ORAL | 0 refills | Status: DC

## 2023-10-22 NOTE — Patient Instructions (Signed)

## 2023-10-22 NOTE — Progress Notes (Signed)
New Patient Office Visit  Subjective    Patient ID: Steven Lara, male    DOB: [birth year]  Age: 68 y.o. MRN: [id number]  CC:  Chief Complaint  Patient presents with   Cough    Sxs started around Christmas, productive cough, fatigue, and winded. No COVID test. Pt states taking Robitussin.  Pt states taking left over amoxi last week.    HPI Steven Lara presents to establish care Discussed the use of AI scribe software for clinical note transcription with the patient, who gave verbal consent to proceed.  History of Present Illness   The patient, with a history of being on Humira and methotrexate, presents with a month-long history of chest congestion and coughing. The cough is productive, with the patient reporting the need to 'hack up' several times in the morning and intermittently throughout the day. The sputum is mostly clear. The patient also reports feeling winded and tired.  The patient had a fever initially but reports no recent fevers. He has been self-medicating with amoxicillin, which was leftover from a previous dental procedure, and over-the-counter cough syrup. The patient has a history of pneumonia but has received the pneumonia vaccine in the past. He also reports a pattern of catching a similar illness around the start of the year for the past several years.  The patient is due for a dose of methotrexate but has decided to hold off for a week. He has no known allergies and has previously taken a Z-Pak. The patient is also on Humira. He is due to care for his elderly mother and is concerned about his ability to do so given his current symptoms.       Outpatient Encounter Medications as of 10/22/2023  Medication Sig   alfuzosin (UROXATRAL) 10 MG 24 hr tablet Take 1 tablet (10 mg total) by mouth daily.   azithromycin (ZITHROMAX Z-PAK) 250 MG tablet As directed   cetirizine (ZYRTEC) 10 MG tablet Take 10 mg by mouth daily.   folic acid (FOLVITE) 1 MG tablet Take 1 mg by  mouth daily.   HUMIRA PEN 40 MG/0.4ML PNKT SMARTSIG:40 Milligram(s) SUB-Q Every 2 Weeks   methotrexate (RHEUMATREX) 2.5 MG tablet Caution:Chemotherapy. Protect from light. USE AS DIRECTED   zolpidem (AMBIEN) 10 MG tablet TAKE 1 TABLET BY MOUTH EVERY NIGHT AT BEDTIME AS NEEDED FOR SLEEP   No facility-administered encounter medications on file as of 10/22/2023.    Past Medical History:  Diagnosis Date   Allergy    seasonal   Arthritis    Colon polyps    02/07/2007 Next Due: 02/2012 Results: Adenomatous Polyp (Dr Marina Goodell)    Diverticulosis    GERD (gastroesophageal reflux disease) 07/14/2013   Insomnia    Mole (skin)    sees derm   Reiter's syndrome    dx remotely and again 12/2008 (L eye iritys R foot problems)    Past Surgical History:  Procedure Laterality Date   COLONOSCOPY W/ POLYPECTOMY      Family History  Problem Relation Age of Onset   Dementia Father    Prostate cancer Father        question of    Arthritis Brother    COPD Mother    Heart attack Neg Hx    Diabetes Neg Hx    Colon cancer Neg Hx     Social History   Socioeconomic History   Marital status: Widowed    Spouse name: Not on file   Number of children:  2   Years of education: Not on file   Highest education level: Not on file  Occupational History   Occupation: Print production planner   Tobacco Use   Smoking status: Never   Smokeless tobacco: Never  Substance and Sexual Activity   Alcohol use: Yes    Comment: socially   Drug use: No   Sexual activity: Not on file  Other Topics Concern   Not on file  Social History Narrative   Married , wife dx w/ rectal cancer 2013-- going to [employer] [facility]) ; lost wife ~ 03-2014   Children    Daughter - grad school, finished a Artist - college    Social Drivers of Corporate investment banker Strain: Not on file  Food Insecurity: Not on file  Transportation Needs: Not on file  Physical Activity: Not on file  Stress: Not on file  Social  Connections: Not on file  Intimate Partner Violence: Not on file    Review of Systems  Constitutional:  Positive for chills, fever and malaise/fatigue.  HENT:  Negative for congestion.   Eyes:  Negative for blurred vision.  Respiratory:  Negative for cough.   Cardiovascular:  Negative for chest pain and palpitations.  Gastrointestinal:  Negative for vomiting.  Musculoskeletal:  Negative for back pain.  Skin:  Negative for rash.  Neurological:  Negative for loss of consciousness and headaches.        Objective    BP 112/70 (BP Location: Left Arm, Patient Position: Sitting, Cuff Size: Large)   Pulse 68   Temp 97.6 F (36.4 C) (Oral)   Resp 18   Ht 5\' 9"  (1.753 m)   Wt 245 lb (111.1 kg)   SpO2 95%   BMI 36.18 kg/m   Physical Exam Vitals and nursing note reviewed.  Constitutional:      General: He is not in acute distress.    Appearance: Normal appearance. He is well-developed.  HENT:     Head: Normocephalic and atraumatic.  Eyes:     General: No scleral icterus.       Right eye: No discharge.        Left eye: No discharge.  Cardiovascular:     Rate and Rhythm: Normal rate and regular rhythm.     Heart sounds: No murmur heard. Pulmonary:     Effort: Pulmonary effort is normal. No respiratory distress.     Breath sounds: Normal breath sounds.  Musculoskeletal:        General: Normal range of motion.     Cervical back: Normal range of motion and neck supple.     Right lower leg: No edema.     Left lower leg: No edema.  Skin:    General: Skin is warm and dry.  Neurological:     Mental Status: He is alert and oriented to person, place, and time.  Psychiatric:        Mood and Affect: Mood normal.        Behavior: Behavior normal.        Thought Content: Thought content normal.        Judgment: Judgment normal.         Assessment & Plan:   Problem List Items Addressed This Visit       Unprioritized   Obesity, morbid (HCC) - Primary   Other Visit  Diagnoses       Bronchitis       Relevant Medications  azithromycin (ZITHROMAX Z-PAK) 250 MG tablet     Assessment and Plan    Chronic Bronchitis Persistent chest congestion and cough have been present for 3-4 weeks. Initially febrile, now afebrile, with clear sputum production, especially in the morning. There is no significant chest tightness. Physical examination shows slightly swollen glands but clear lung sounds. Current medications include Humira and methotrexate, which may affect immune response. Treatment involves prescribing azithromycin for 5 days and continuing the current over-the-counter cough medicine as needed. If symptoms worsen, consider escalating to a different cough medicine or steroids. Advised to wear a mask around his mother to prevent potential transmission.  General Health Maintenance Humira and methotrexate are due but will be held off for a week. There are no known allergies. A pneumonia vaccination was received years ago. Ensure timely administration of Humira and methotrexate as per schedule and monitor for any adverse reactions or new symptoms.        Return if symptoms worsen or fail to improve.   Donato Schultz, DO

## 2023-11-15 ENCOUNTER — Other Ambulatory Visit: Payer: Self-pay | Admitting: Family Medicine

## 2023-11-15 ENCOUNTER — Telehealth: Payer: Self-pay

## 2023-11-15 DIAGNOSIS — R051 Acute cough: Secondary | ICD-10-CM

## 2023-11-15 MED ORDER — PROMETHAZINE-DM 6.25-15 MG/5ML PO SYRP: 5.0000 mL | ORAL_SOLUTION | Freq: Four times a day (QID) | ORAL | 0 refills | Status: DC | PRN

## 2023-11-15 NOTE — Telephone Encounter (Signed)
 Copied from CRM [id number]. Topic: Clinical - Medication Question >> Nov 15, 2023 11:29 AM Laurina Popper O wrote: Reason for CRM: patient is calling cause he was in a few weeks ago with a cough and told patient to take over the counter medication for cough and to call if things doesn't get better . Patient says he wants to know can the dr call him in something for his cough that he still has and has not got better  [phone]

## 2023-11-15 NOTE — Telephone Encounter (Signed)
 Pt seen by Dr. Jalene Mayor on 10/22/23

## 2023-11-16 DIAGNOSIS — M5030 Other cervical disc degeneration, unspecified cervical region: Secondary | ICD-10-CM | POA: Diagnosis not present

## 2023-11-16 DIAGNOSIS — M450 Ankylosing spondylitis of multiple sites in spine: Secondary | ICD-10-CM | POA: Diagnosis not present

## 2023-11-16 DIAGNOSIS — H209 Unspecified iridocyclitis: Secondary | ICD-10-CM | POA: Diagnosis not present

## 2023-11-16 DIAGNOSIS — Z79899 Other long term (current) drug therapy: Secondary | ICD-10-CM | POA: Diagnosis not present

## 2023-12-01 ENCOUNTER — Other Ambulatory Visit: Payer: Self-pay | Admitting: Urology

## 2023-12-01 ENCOUNTER — Telehealth: Payer: Self-pay | Admitting: Internal Medicine

## 2023-12-01 DIAGNOSIS — R300 Dysuria: Secondary | ICD-10-CM

## 2023-12-01 NOTE — Telephone Encounter (Signed)
PDMP okay, prescription sent 

## 2023-12-01 NOTE — Telephone Encounter (Signed)
 Requesting: Ambien 10 MG Contract: Yes 08/04/23 UDS: None Last Visit: 08/04/2023 Next Visit: 08/09/2024 Last Refill: 06/03/2023  Please Advise

## 2023-12-08 ENCOUNTER — Encounter: Payer: Self-pay | Admitting: Urology

## 2023-12-08 ENCOUNTER — Ambulatory Visit: Admitting: Urology

## 2023-12-08 VITALS — BP 134/88 | HR 63 | Ht 69.0 in | Wt 239.0 lb

## 2023-12-08 DIAGNOSIS — R300 Dysuria: Secondary | ICD-10-CM | POA: Diagnosis not present

## 2023-12-08 DIAGNOSIS — R339 Retention of urine, unspecified: Secondary | ICD-10-CM | POA: Diagnosis not present

## 2023-12-08 DIAGNOSIS — N401 Enlarged prostate with lower urinary tract symptoms: Secondary | ICD-10-CM

## 2023-12-08 DIAGNOSIS — N138 Other obstructive and reflux uropathy: Secondary | ICD-10-CM

## 2023-12-08 LAB — MICROSCOPIC EXAMINATION

## 2023-12-08 LAB — URINALYSIS, ROUTINE W REFLEX MICROSCOPIC
Bilirubin, UA: NEGATIVE
Glucose, UA: NEGATIVE
Ketones, UA: NEGATIVE
Leukocytes,UA: NEGATIVE
Nitrite, UA: NEGATIVE
Protein,UA: NEGATIVE
Specific Gravity, UA: 1.025 (ref 1.005–1.030)
Urobilinogen, Ur: 0.2 mg/dL (ref 0.2–1.0)
pH, UA: 7 (ref 5.0–7.5)

## 2023-12-08 MED ORDER — ALFUZOSIN HCL ER 10 MG PO TB24: 10.0000 mg | ORAL_TABLET | Freq: Every day | ORAL | 3 refills | Status: AC

## 2023-12-08 NOTE — Progress Notes (Addendum)
 Assessment: 1. BPH with obstruction/lower urinary tract symptoms   2. Incomplete bladder emptying   3. Dysuria     Plan: Continue alfuzosin 10 mg daily.   Return to office in 1 year  Chief Complaint:  Chief Complaint  Patient presents with   Benign Prostatic Hypertrophy    History of Present Illness:  Steven Lara is a 68 y.o. male who is seen for further evaluation of frequency, urgency, and mild dysuria.  He has a history of Reiter's syndrome and has previously been evaluated for urethritis/prostatitis.  He was seen by Dr. Shelby Dubin in October 2014.  His symptoms resolved at that time.  At his visit in 1/24, he reported onset of urinary symptoms approximately 6 weeks prior with increased frequency, urgency, and constant sensation of dysuria.  He also had nocturia x 2.  He was given a course of doxycycline x 10 days with some slight improvement in his symptoms.  He continued with frequency, urgency, and dysuria.  He was voiding with a good stream.  No gross hematuria. Dipstick urinalysis from him 09/15/2022 showed trace blood.  Urine culture showed no growth. Repeat urinalysis from 09/29/2022 showed 1+ leukocytes.  Urine culture again showed no growth.  PSA from 09/15/2022: 1.08 PVR = 94 ml.  He was given a trial of alfuzosin 10 mg daily in 1/24.  At his visit in February 2024, he continued on alfuzosin 10 mg daily. He noted improvement in his lower urinary tract symptoms with improved force of stream, less hesitancy, and sensation of complete emptying.  He is not having any dysuria or gross hematuria.  Overall, he is pleased with the results of the medication.  No side effects. IPSS = 8. PVR = 79 mL  PSA from 10/24: 1.65  He returns today for follow-up.  He is continued on alfuzosin.  His urinary symptoms have remained very well-controlled.  No dysuria or gross hematuria. IPSS = 4/2 today.  Portions of the above documentation were copied from a prior visit for review  purposes only.   Past Medical History:  Past Medical History:  Diagnosis Date   Allergy    seasonal   Arthritis    Colon polyps    02/07/2007 Next Due: 02/2012 Results: Adenomatous Polyp (Dr Marina Goodell)    Diverticulosis    GERD (gastroesophageal reflux disease) 07/14/2013   Insomnia    Mole (skin)    sees derm   Reiter's syndrome    dx remotely and again 12/2008 (L eye iritys R foot problems)    Past Surgical History:  Past Surgical History:  Procedure Laterality Date   COLONOSCOPY W/ POLYPECTOMY      Allergies:  No Known Allergies  Family History:  Family History  Problem Relation Age of Onset   Dementia Father    Prostate cancer Father        question of    Arthritis Brother    COPD Mother    Heart attack Neg Hx    Diabetes Neg Hx    Colon cancer Neg Hx     Social History:  Social History   Tobacco Use   Smoking status: Never   Smokeless tobacco: Never  Substance Use Topics   Alcohol use: Yes    Comment: socially   Drug use: No    ROS: Constitutional:  Negative for fever, chills, weight loss CV: Negative for chest pain, previous MI, hypertension Respiratory:  Negative for shortness of breath, wheezing, sleep apnea, frequent cough GI:  Negative for nausea, vomiting, bloody stool, GERD  Physical exam: BP 134/88   Pulse 63   Ht 5\' 9"  (1.753 m)   Wt 239 lb (108.4 kg)   BMI 35.29 kg/m  GENERAL APPEARANCE:  Well appearing, well developed, well nourished, NAD HEENT:  Atraumatic, normocephalic, oropharynx clear NECK:  Supple without lymphadenopathy or thyromegaly ABDOMEN:  Soft, non-tender, no masses EXTREMITIES:  Moves all extremities well, without clubbing, cyanosis, or edema NEUROLOGIC:  Alert and oriented x 3, normal gait, CN II-XII grossly intact MENTAL STATUS:  appropriate BACK:  Non-tender to palpation, No CVAT SKIN:  Warm, dry, and intact   Results: Results for orders placed or performed in visit on 12/08/23 (from the past 24 hours)   Urinalysis, Routine w reflex microscopic     Status: Abnormal   Collection Time: 12/08/23 12:00 AM  Result Value Ref Range   Specific Gravity, UA 1.025 1.005 - 1.030   pH, UA 7.0 5.0 - 7.5   Color, UA Yellow Yellow   Appearance Ur Clear Clear   Leukocytes,UA Negative Negative   Protein,UA Negative Negative/Trace   Glucose, UA Negative Negative   Ketones, UA Negative Negative   RBC, UA Trace (A) Negative   Bilirubin, UA Negative Negative   Urobilinogen, Ur 0.2 0.2 - 1.0 mg/dL   Nitrite, UA Negative Negative   Microscopic Examination See below:    Narrative   Performed at:  [address] [facility]  Urology [facility] [address], [place], [place]  [id number] Lab Director: Corinna Lines MT, Phone:  [phone]  Microscopic Examination     Status: Abnormal   Collection Time: 12/08/23 12:00 AM   Urine  Result Value Ref Range   WBC, UA 0-5 0 - 5 /hpf   RBC, Urine 0-2 0 - 2 /hpf   Epithelial Cells (non renal) 0-10 0 - 10 /hpf   Casts Present (A) None seen /lpf   Cast Type Hyaline casts N/A   Bacteria, UA Few None seen/Few   Narrative   Performed at:  [address] [facility]  Urology [facility] [address], [place], [place]  [id number] Lab Director: Corinna Lines MT, Phone:  [phone]

## 2024-02-16 DIAGNOSIS — Z111 Encounter for screening for respiratory tuberculosis: Secondary | ICD-10-CM | POA: Diagnosis not present

## 2024-02-16 DIAGNOSIS — M5030 Other cervical disc degeneration, unspecified cervical region: Secondary | ICD-10-CM | POA: Diagnosis not present

## 2024-02-16 DIAGNOSIS — H209 Unspecified iridocyclitis: Secondary | ICD-10-CM | POA: Diagnosis not present

## 2024-02-16 DIAGNOSIS — M450 Ankylosing spondylitis of multiple sites in spine: Secondary | ICD-10-CM | POA: Diagnosis not present

## 2024-02-16 DIAGNOSIS — Z79899 Other long term (current) drug therapy: Secondary | ICD-10-CM | POA: Diagnosis not present

## 2024-05-17 DIAGNOSIS — Z79899 Other long term (current) drug therapy: Secondary | ICD-10-CM | POA: Diagnosis not present

## 2024-05-29 ENCOUNTER — Telehealth: Payer: Self-pay | Admitting: Internal Medicine

## 2024-05-29 NOTE — Telephone Encounter (Signed)
 Requesting: Ambien  10mg   Contract: 08/09/23 UDS: Ambien  only Last Visit: 10/22/23 Next Visit: 08/09/24 Last Refill: 12/01/23 #30 and 5RF  Please Advise

## 2024-05-29 NOTE — Telephone Encounter (Signed)
 PDMP okay, Rx sent

## 2024-06-29 DIAGNOSIS — D229 Melanocytic nevi, unspecified: Secondary | ICD-10-CM | POA: Diagnosis not present

## 2024-06-29 DIAGNOSIS — L814 Other melanin hyperpigmentation: Secondary | ICD-10-CM | POA: Diagnosis not present

## 2024-08-09 ENCOUNTER — Encounter: Payer: BC Managed Care – PPO | Admitting: Internal Medicine

## 2024-08-16 DIAGNOSIS — M5030 Other cervical disc degeneration, unspecified cervical region: Secondary | ICD-10-CM | POA: Diagnosis not present

## 2024-08-16 DIAGNOSIS — H209 Unspecified iridocyclitis: Secondary | ICD-10-CM | POA: Diagnosis not present

## 2024-08-16 DIAGNOSIS — Z79899 Other long term (current) drug therapy: Secondary | ICD-10-CM | POA: Diagnosis not present

## 2024-08-29 ENCOUNTER — Encounter: Admitting: Family

## 2024-09-04 ENCOUNTER — Emergency Department (HOSPITAL_COMMUNITY)

## 2024-09-04 ENCOUNTER — Emergency Department (HOSPITAL_COMMUNITY)
Admission: EM | Admit: 2024-09-04 | Discharge: 2024-09-04 | Disposition: A | Discharge status: Home / Self Care | Attending: Emergency Medicine | Admitting: Emergency Medicine

## 2024-09-04 ENCOUNTER — Other Ambulatory Visit: Payer: Self-pay

## 2024-09-04 DIAGNOSIS — M4803 Spinal stenosis, cervicothoracic region: Secondary | ICD-10-CM | POA: Diagnosis not present

## 2024-09-04 DIAGNOSIS — Y92410 Unspecified street and highway as the place of occurrence of the external cause: Secondary | ICD-10-CM | POA: Diagnosis not present

## 2024-09-04 DIAGNOSIS — R29818 Other symptoms and signs involving the nervous system: Secondary | ICD-10-CM | POA: Diagnosis not present

## 2024-09-04 DIAGNOSIS — M47816 Spondylosis without myelopathy or radiculopathy, lumbar region: Secondary | ICD-10-CM | POA: Diagnosis not present

## 2024-09-04 DIAGNOSIS — Z041 Encounter for examination and observation following transport accident: Secondary | ICD-10-CM | POA: Diagnosis not present

## 2024-09-04 DIAGNOSIS — M47812 Spondylosis without myelopathy or radiculopathy, cervical region: Secondary | ICD-10-CM | POA: Diagnosis not present

## 2024-09-04 DIAGNOSIS — R4182 Altered mental status, unspecified: Secondary | ICD-10-CM | POA: Diagnosis not present

## 2024-09-04 DIAGNOSIS — M4802 Spinal stenosis, cervical region: Secondary | ICD-10-CM | POA: Diagnosis not present

## 2024-09-04 DIAGNOSIS — R404 Transient alteration of awareness: Secondary | ICD-10-CM | POA: Insufficient documentation

## 2024-09-04 LAB — URINALYSIS, W/ REFLEX TO CULTURE (INFECTION SUSPECTED)
Bilirubin Urine: NEGATIVE
Glucose, UA: NEGATIVE mg/dL
Ketones, ur: NEGATIVE mg/dL
Leukocytes,Ua: NEGATIVE
Nitrite: NEGATIVE
Protein, ur: NEGATIVE mg/dL
Specific Gravity, Urine: 1.02 (ref 1.005–1.030)
pH: 6 (ref 5.0–8.0)

## 2024-09-04 LAB — CBC WITH DIFFERENTIAL/PLATELET
Abs Immature Granulocytes: 0.03 K/uL (ref 0.00–0.07)
Basophils Absolute: 0 K/uL (ref 0.0–0.1)
Basophils Relative: 0 %
Eosinophils Absolute: 0.5 K/uL (ref 0.0–0.5)
Eosinophils Relative: 7 %
HCT: 45.4 % (ref 39.0–52.0)
Hemoglobin: 15.5 g/dL (ref 13.0–17.0)
Immature Granulocytes: 0 %
Lymphocytes Relative: 29 %
Lymphs Abs: 2 K/uL (ref 0.7–4.0)
MCH: 31.9 pg (ref 26.0–34.0)
MCHC: 34.1 g/dL (ref 30.0–36.0)
MCV: 93.4 fL (ref 80.0–100.0)
Monocytes Absolute: 0.8 K/uL (ref 0.1–1.0)
Monocytes Relative: 11 %
Neutro Abs: 3.6 K/uL (ref 1.7–7.7)
Neutrophils Relative %: 53 %
Platelets: 245 K/uL (ref 150–400)
RBC: 4.86 MIL/uL (ref 4.22–5.81)
RDW: 12.4 % (ref 11.5–15.5)
WBC: 6.9 K/uL (ref 4.0–10.5)
nRBC: 0 % (ref 0.0–0.2)

## 2024-09-04 LAB — COMPREHENSIVE METABOLIC PANEL WITH GFR
ALT: 15 U/L (ref 0–44)
AST: 28 U/L (ref 15–41)
Albumin: 3.1 g/dL — ABNORMAL LOW (ref 3.5–5.0)
Alkaline Phosphatase: 46 U/L (ref 38–126)
Anion gap: 8 (ref 5–15)
BUN: 16 mg/dL (ref 8–23)
CO2: 23 mmol/L (ref 22–32)
Calcium: 8.1 mg/dL — ABNORMAL LOW (ref 8.9–10.3)
Chloride: 109 mmol/L (ref 98–111)
Creatinine, Ser: 0.88 mg/dL (ref 0.61–1.24)
GFR, Estimated: >60 mL/min (ref >60)
Glucose, Bld: 100 mg/dL — ABNORMAL HIGH (ref 70–99)
Potassium: 4.2 mmol/L (ref 3.5–5.1)
Sodium: 140 mmol/L (ref 135–145)
Total Bilirubin: 1.2 mg/dL (ref 0.0–1.2)
Total Protein: 5.8 g/dL — ABNORMAL LOW (ref 6.5–8.1)

## 2024-09-04 LAB — RAPID URINE DRUG SCREEN, HOSP PERFORMED
Amphetamines: NOT DETECTED
Barbiturates: NOT DETECTED
Benzodiazepines: NOT DETECTED
Cocaine: NOT DETECTED
Opiates: NOT DETECTED
Tetrahydrocannabinol: NOT DETECTED

## 2024-09-04 LAB — PROTIME-INR
INR: 1 (ref 0.8–1.2)
Prothrombin Time: 13.2 s (ref 11.4–15.2)

## 2024-09-04 LAB — I-STAT CHEM 8, ED
BUN: 16 mg/dL (ref 8–23)
Calcium, Ion: 1.04 mmol/L — ABNORMAL LOW (ref 1.15–1.40)
Chloride: 109 mmol/L (ref 98–111)
Creatinine, Ser: 0.8 mg/dL (ref 0.61–1.24)
Glucose, Bld: 97 mg/dL (ref 70–99)
HCT: 42 % (ref 39.0–52.0)
Hemoglobin: 14.3 g/dL (ref 13.0–17.0)
Potassium: 4.1 mmol/L (ref 3.5–5.1)
Sodium: 142 mmol/L (ref 135–145)
TCO2: 23 mmol/L (ref 22–32)

## 2024-09-04 LAB — TROPONIN I (HIGH SENSITIVITY)
Troponin I (High Sensitivity): 4 ng/L (ref <18)
Troponin I (High Sensitivity): 3 ng/L (ref <18)

## 2024-09-04 LAB — ETHANOL: Alcohol, Ethyl (B): <15 mg/dL (ref <15)

## 2024-09-04 LAB — CBG MONITORING, ED: Glucose-Capillary: 104 mg/dL — ABNORMAL HIGH (ref 70–99)

## 2024-09-04 MED ORDER — IOHEXOL 350 MG/ML SOLN
75.0000 mL | Freq: Once | INTRAVENOUS | Status: AC | PRN
  Administered 2024-09-04: 75 mL via INTRAVENOUS

## 2024-09-04 NOTE — ED Notes (Signed)
 Patient transported to CT

## 2024-09-04 NOTE — ED Notes (Signed)
 Port XR at [facility]
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
## 2024-09-04 NOTE — Discharge Instructions (Signed)
 You may have had a seizure today.  Due to this it is recommended you do not drive until you are cleared by the neurologist.  You should also not do other activity that would be deleterious to be had a seizure such as taking a bath by herself, being on a roof/ladder, etc.  We are referring you to neurology, do not drive until you are cleared by them.  If you develop recurrent symptoms, or any other new/concerning symptoms then return to the ER.

## 2024-09-04 NOTE — ED Notes (Signed)
 Patient transported to MRI

## 2024-09-12 ENCOUNTER — Ambulatory Visit: Admitting: Neurology

## 2024-09-12 ENCOUNTER — Encounter: Payer: Self-pay | Admitting: Neurology

## 2024-09-12 VITALS — BP 143/100 | HR 74 | Ht 68.0 in | Wt 241.0 lb

## 2024-09-12 DIAGNOSIS — R55 Syncope and collapse: Secondary | ICD-10-CM | POA: Diagnosis not present
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
## 2024-09-18 ENCOUNTER — Other Ambulatory Visit: Admitting: *Deleted

## 2024-09-18 DIAGNOSIS — R55 Syncope and collapse: Secondary | ICD-10-CM

## 2024-09-23 ENCOUNTER — Other Ambulatory Visit: Payer: Self-pay | Admitting: Internal Medicine

## 2024-09-25 ENCOUNTER — Ambulatory Visit: Payer: Self-pay | Admitting: Neurology

## 2024-09-25 ENCOUNTER — Telehealth: Payer: Self-pay | Admitting: Internal Medicine
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
## 2024-09-25 NOTE — Telephone Encounter (Signed)
 Pt dropped off forms to be completed by pcp. Pt asks to be called to go over paperwork. Pt asks to be called when reayd to pick up. Placed in providers tray in fo

## 2024-09-25 NOTE — Telephone Encounter (Signed)
 Pt dropped off forms to be completed by pcp. Pt is asking that

## 2024-09-26 ENCOUNTER — Ambulatory Visit: Admitting: Internal Medicine

## 2024-09-26 ENCOUNTER — Ambulatory Visit (HOSPITAL_COMMUNITY)

## 2024-09-26 DIAGNOSIS — R55 Syncope and collapse: Secondary | ICD-10-CM | POA: Diagnosis not present
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
## 2024-09-26 NOTE — Telephone Encounter (Signed)
 Form completed today w/ Dr. Amon. Copy of form sent for scanning. Original copy given back to Pt.

## 2024-09-26 NOTE — Patient Instructions (Addendum)
 go to the front desk for the checkout Please make an appointment  for a physical exam at your convenience     Discuss with neurology  your paperwork

## 2024-09-27 DIAGNOSIS — Z0289 Encounter for other administrative examinations: Secondary | ICD-10-CM

## 2024-10-24 ENCOUNTER — Ambulatory Visit: Admitting: Internal Medicine

## 2024-10-24 ENCOUNTER — Encounter: Payer: Self-pay | Admitting: Internal Medicine

## 2024-10-24 ENCOUNTER — Telehealth: Payer: Self-pay | Admitting: *Deleted

## 2024-10-24 VITALS — BP 130/80 | HR 74 | Temp 98.1°F | Resp 16 | Ht 68.0 in | Wt 242.0 lb

## 2024-10-24 DIAGNOSIS — Z0000 Encounter for general adult medical examination without abnormal findings: Secondary | ICD-10-CM

## 2024-10-24 NOTE — Progress Notes (Unsigned)
 "  Subjective:    Patient ID: Steven Lara, male    DOB: 1956/05/25, 69 y.o.   MRN: [id number]  DOS:  10/24/2024 CPX Discussed the use of AI scribe software for clinical note transcription with the patient, who gave verbal consent to proceed.  History of Present Illness Steven Lara is a 69 year old male with ankylosing spondylitis who presents for physical exam.  Neck pain and functional limitation.  Ongoing problem, follow-up by rheumatology, on Humira and MTX.  Epistaxis - Occasional right-sided epistaxis from a consistent spot - Bleeding occurs when removing dried mucus  Sleep disturbance - Takes Ambien  for sleep     Review of Systems  Other than above, a 14 point review of systems is negative      Past Medical History:  Diagnosis Date   Allergy    seasonal   Arthritis    Colon polyps    02/07/2007 Next Due: 02/2012 Results: Adenomatous Polyp (Dr Abran)    Diverticulosis    GERD (gastroesophageal reflux disease) 07/14/2013   Insomnia    Mole (skin)    sees derm   Reiter's syndrome    dx remotely and again 12/2008 (L eye iritys R foot problems)    Past Surgical History:  Procedure Laterality Date   COLONOSCOPY W/ POLYPECTOMY      Current Outpatient Medications  Medication Instructions   alfuzosin  (UROXATRAL ) 10 mg, Oral, Daily   cetirizine (ZYRTEC) 10 mg, Daily   folic acid (FOLVITE) 1 mg, Daily   HUMIRA PEN 40 MG/0.4ML PNKT SMARTSIG:40 Milligram(s) SUB-Q Every 2 Weeks   methotrexate (RHEUMATREX) 2.5 MG tablet 5 tablets, Weekly   zolpidem  (AMBIEN ) 10 mg, Oral, At bedtime PRN, for sleep       Objective:   Physical Exam BP 130/80   Pulse 74   Temp 98.1 F (36.7 C) (Oral)   Resp 16   Ht 5' 8 (1.727 m)   Wt 242 lb (109.8 kg)   SpO2 97%   BMI 36.80 kg/m  General: Well developed, NAD, BMI noted Neck: No  thyromegaly  HEENT:  Normocephalic . Face symmetric, atraumatic. Nostrils: No obvious lesion or polyp Lungs:  CTA B Normal respiratory  effort, no intercostal retractions, no accessory muscle use. Heart: RRR,  no murmur.  Abdomen:  Not distended, soft, non-tender. No rebound or rigidity.   Lower extremities: no pretibial edema bilaterally  Skin: Exposed areas without rash. Not pale. Not jaundice Neurologic:  alert & oriented X3.  Speech normal, gait appropriate for age and unassisted Strength symmetric and appropriate for age.  Psych: Cognition and judgment appear intact.  Cooperative with normal attention span and concentration.  Behavior appropriate. No anxious or depressed appearing.     Assessment     Assessment Insomnia Reiter's syndrome, ankylosis spondylitis ---Dr Mai , sees opht GERD Seasonal allergies  Moles: Sees dermatology BPH, Dx 2024 Assessment & Plan Here for CPX - Tdap 03-16-2017   - PNM shot 2011; prevnar: 2016; PNM 20: 2023 - s/p shingrex x2 - Vaccine advise: Flu shot , COVID booster, RSV. Declined  Benefits discussed.  - CCS: Colonoscopy (Dr Abran)  02/07/2007, Cscope 10-2016, 10 years per GI letter   -Prostate cancer screening: to see urology next month -Encouraged to stay active, take walks.  He plans to sign with a [employer]   Other issues Ankylosing spondylitis Chronic neck pain associated with ankylosing spondylitis, managed with Humira and methotrexate.  Follow-up by rheumatology Epistaxis Intermittent epistaxis from the right nostril,  likely due to dry nasal passages and manipulation. No major abnormalities on examination. - Recommend to use nasal saline spray and/or Vaseline at night to keep nasal passages moist. - Soften mucus with water before removal to prevent bleeding. - If epistaxis persists, will consider referral to ENT for further evaluation. Insomnia: On Ambien  as needed Hypocalcemia Ionized calcium level of 1.4.  - Rechecked ionized calcium and LFT levels. RTC 1 year.   "

## 2024-10-24 NOTE — Telephone Encounter (Signed)
 Pt [facility] form faxed on 10/23/2024

## 2024-10-24 NOTE — Patient Instructions (Addendum)
 Please read your instructions carefully.   GO TO THE LAB :  Get the blood work    Go to the front desk for the checkout Please make an appointment for a physical exam in 1 year   Vaccines to consider: Flu shot every fall COVID booster RSV   You have occasional nosebleeds from your right nostril, likely due to dry nasal passages and manipulation. -Use nasal saline spray at night to keep your nasal passages moist. - You may also use some Vaseline in your nose at bedtime -Soften mucus with water before removal to prevent bleeding. -If nosebleeds persist, we may refer you to an ENT specialist for further evaluation.  HYPOCALCEMIA: Your ionized calcium level is slightly elevated. -We rechecked your ionized calcium levels and checked your cholesterol levels.

## 2024-10-25 ENCOUNTER — Encounter: Payer: Self-pay | Admitting: Internal Medicine

## 2024-10-25 LAB — HEPATIC FUNCTION PANEL
ALT: 16 U/L (ref 3–53)
AST: 19 U/L (ref 5–37)
Albumin: 4.2 g/dL (ref 3.5–5.2)
Alkaline Phosphatase: 55 U/L (ref 39–117)
Bilirubin, Direct: 0.2 mg/dL (ref 0.1–0.3)
Total Bilirubin: 1.2 mg/dL (ref 0.2–1.2)
Total Protein: 6.9 g/dL (ref 6.0–8.3)

## 2024-10-25 LAB — LIPID PANEL
Cholesterol: 205 mg/dL — ABNORMAL HIGH (ref 28–200)
HDL: 55 mg/dL
LDL Cholesterol: 130 mg/dL — ABNORMAL HIGH (ref 10–99)
NonHDL: 150.17
Total CHOL/HDL Ratio: 4
Triglycerides: 101 mg/dL (ref 10.0–149.0)
VLDL: 20.2 mg/dL (ref 0.0–40.0)

## 2024-10-25 LAB — CALCIUM, IONIZED: Calcium, Ion: 5.1 mg/dL (ref 4.7–5.5)

## 2024-10-25 NOTE — Assessment & Plan Note (Signed)
 Here for CPX - Tdap 03-16-2017   - PNM shot 2011; prevnar: 2016; PNM 20: 2023 - s/p shingrex x2 - Vaccine advise: Flu shot , COVID booster, RSV. Declined  Benefits discussed.  - CCS: Colonoscopy (Dr Abran)  02/07/2007, Cscope 10-2016, 10 years per GI letter   -Prostate cancer screening: to see urology next month -Encouraged to stay active, take walks.  He plans to sign with a [employer]

## 2024-10-25 NOTE — Assessment & Plan Note (Signed)
 Here for CPX  Other issues Ankylosing spondylitis Chronic neck pain associated with ankylosing spondylitis, managed with Humira and methotrexate.  Follow-up by rheumatology Epistaxis Intermittent epistaxis from the right nostril, likely due to dry nasal passages and manipulation. No major abnormalities on examination. - Recommend to use nasal saline spray and/or Vaseline at night to keep nasal passages moist. - Soften mucus with water before removal to prevent bleeding. - If epistaxis persists, will consider referral to ENT for further evaluation. Insomnia: On Ambien  as needed Hypocalcemia Ionized calcium level of 1.4.  - Rechecked ionized calcium and LFT levels. RTC 1 year.

## 2024-10-27 ENCOUNTER — Ambulatory Visit: Payer: Self-pay | Admitting: Internal Medicine

## 2024-12-07 ENCOUNTER — Ambulatory Visit: Admitting: Urology

## 2025-08-01 ENCOUNTER — Ambulatory Visit: Admitting: Neurology

## 2025-10-26 ENCOUNTER — Encounter: Admitting: Internal Medicine
